# Patient Record
Sex: Male | Born: 1950 | Race: White | Hispanic: Yes | Marital: Married | Smoking: Never smoker
Health system: Southern US, Community
[De-identification: ages and names within clinical notes are randomized; demographics above are authoritative.]

## PROBLEM LIST (undated history)

## (undated) DIAGNOSIS — E119 Type 2 diabetes mellitus without complications: Secondary | ICD-10-CM

## (undated) DIAGNOSIS — I509 Heart failure, unspecified: Secondary | ICD-10-CM

## (undated) DIAGNOSIS — N189 Chronic kidney disease, unspecified: Secondary | ICD-10-CM

## (undated) DIAGNOSIS — R0600 Dyspnea, unspecified: Secondary | ICD-10-CM

## (undated) HISTORY — PX: NO PAST SURGERIES: SHX2092

---

## 2006-12-01 ENCOUNTER — Ambulatory Visit: Payer: Self-pay | Admitting: Internal Medicine

## 2010-09-25 NOTE — Assessment & Plan Note (Signed)
[facility]                             PULMONARY OFFICE NOTE   AWAIS, COBARRUBIAS                         MRN:          [id number]  DATE:12/01/2006                            DOB:          September 09, 1950    REFERRING PHYSICIAN:  Arlan Organ   REASON FOR CONSULTATION:  Abnormal spirometry.   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  He flunked his  most recent spirometry and therefore is see at Dr. Samuel Germany request.  Interestingly, he tells me he does not have any trouble at all working.  He denies any exertional chest pain, orthopnea, PND, or leg swelling,  obvious environmental or weather triggers.  Typically when he has  trouble sleeping with breathing at night it is immediately on lying  down.   PAST MEDICAL HISTORY:  1. Hypertension.  2. Diabetes.  3. Hyperlipidemia.   ALLERGIES:  None known.   MEDICATIONS:  Vasotec, hydrochlorothiazide, Lovastatin, Glucovance.  See  medication face sheet, column dated December 01, 2006, for details.   SOCIAL HISTORY:  He has never smoked.  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   FAMILY HISTORY:  Negative for respiratory diseases or atypia.   FAMILY HISTORY:  Recorded in detail on the worksheet and negative for  respiratory disease or atypia.   REVIEW OF SYSTEMS:  Also taken in detail, negative except as outlined  above.   PHYSICAL EXAMINATION:  GENERAL:  Somewhat depressed appearing Latin  American male in no acute distress.  VITAL SIGNS:  He is afebrile with stable vital signs with a blood  pressure of 140/86.  HEENT:  Remarkably unremarkable.  Oropharynx is clear.  Nasal turbinates  normal.  Ears canals clear bilaterally.  NECK:  Supple without  cervical adenopathy, tenderness.  Trachea is  midline.  No lymphadenopathy.  LUNGS:  Fields perfectly clear bilaterally on auscultation percussion  except for minimal pseudo-wheeze.  HEART:  There is a regular rhythm without murmur, gallop, or rub.  ABDOMEN:  Soft, benign.  EXTREMITIES:  Warm without calf tenderness, cyanosis, clubbing, edema.   Chest x-ray was ordered.  Lab studies included spirometry as recently as  October 28, 2006, which show classic expiratory truncation.   IMPRESSION:  Pseudo-wheeze on exam with expiratory truncation typical of  vocal cord dysfunction.  This is either due to reflux or ACE inhibitors  or both.  I did give the patient a reflux diet and recommended he stay  off the Vasotec and switch over to Micardis 80 at one half tablet daily  until see by Dr. Martha Clan.  If not improving on this regimen, the next step  would be to consider an allergy/asthma workup but I prefer that be done  by Dr. Beaulah Dinning given the extreme difficulty I had obtaining an accurate  history today directly from the patient (The daughter does the best  she  can to interpret but I think a lot was missed in translation that  could be overcome by him working directly with a Spanish-speaking asthma  specialist.  I have advised the patient that it will take up to 6 weeks  for the full washout of the ACE inhibitor to see to what extent this  is contributing to this symptoms.  I also note the patient has multiple nonspecific and quite vague  symptoms such as fatigue that may have nothing to do with his  respiratory complaints but I did not specifically address today and  again would have difficulty with this given the language barrier.  I  will therefore see the patient back on a p.r.n. basis at Dr. Samuel Germany  request.     Charlaine Dalton. Sherene Sires, MD, [facility]  Electronically Signed    MBW/MedQ  DD: 12/01/2006  DT: 12/01/2006  Job #: [id number]   cc:   Arlan Organ

## 2017-05-07 DIAGNOSIS — I509 Heart failure, unspecified: Secondary | ICD-10-CM

## 2017-05-07 DIAGNOSIS — E119 Type 2 diabetes mellitus without complications: Secondary | ICD-10-CM

## 2017-05-07 DIAGNOSIS — N179 Acute kidney failure, unspecified: Secondary | ICD-10-CM

## 2017-05-07 DIAGNOSIS — I161 Hypertensive emergency: Secondary | ICD-10-CM

## 2017-05-08 DIAGNOSIS — I509 Heart failure, unspecified: Secondary | ICD-10-CM | POA: Diagnosis not present

## 2017-05-08 DIAGNOSIS — I10 Essential (primary) hypertension: Secondary | ICD-10-CM

## 2017-05-08 DIAGNOSIS — I161 Hypertensive emergency: Secondary | ICD-10-CM | POA: Diagnosis not present

## 2017-05-08 DIAGNOSIS — E119 Type 2 diabetes mellitus without complications: Secondary | ICD-10-CM | POA: Diagnosis not present

## 2017-05-08 DIAGNOSIS — N179 Acute kidney failure, unspecified: Secondary | ICD-10-CM | POA: Diagnosis not present

## 2017-05-09 DIAGNOSIS — I509 Heart failure, unspecified: Secondary | ICD-10-CM | POA: Diagnosis not present

## 2017-05-09 DIAGNOSIS — E119 Type 2 diabetes mellitus without complications: Secondary | ICD-10-CM | POA: Diagnosis not present

## 2017-05-09 DIAGNOSIS — N179 Acute kidney failure, unspecified: Secondary | ICD-10-CM | POA: Diagnosis not present

## 2017-05-09 DIAGNOSIS — I161 Hypertensive emergency: Secondary | ICD-10-CM | POA: Diagnosis not present

## 2017-05-10 DIAGNOSIS — I509 Heart failure, unspecified: Secondary | ICD-10-CM | POA: Diagnosis not present

## 2017-05-10 DIAGNOSIS — N179 Acute kidney failure, unspecified: Secondary | ICD-10-CM | POA: Diagnosis not present

## 2017-05-10 DIAGNOSIS — I161 Hypertensive emergency: Secondary | ICD-10-CM | POA: Diagnosis not present

## 2017-05-10 DIAGNOSIS — E119 Type 2 diabetes mellitus without complications: Secondary | ICD-10-CM | POA: Diagnosis not present

## 2017-05-11 ENCOUNTER — Other Ambulatory Visit: Payer: Self-pay

## 2017-05-11 ENCOUNTER — Inpatient Hospital Stay (HOSPITAL_COMMUNITY)
Admission: AD | Admit: 2017-05-11 | Discharge: 2017-05-17 | DRG: 698 | Disposition: A | Discharge status: Home / Self Care | Payer: Commercial Managed Care - PPO | Source: Other Acute Inpatient Hospital | Attending: Internal Medicine | Admitting: Internal Medicine

## 2017-05-11 ENCOUNTER — Encounter (HOSPITAL_COMMUNITY): Payer: Self-pay | Admitting: Internal Medicine

## 2017-05-11 DIAGNOSIS — Z841 Family history of disorders of kidney and ureter: Secondary | ICD-10-CM

## 2017-05-11 DIAGNOSIS — E119 Type 2 diabetes mellitus without complications: Secondary | ICD-10-CM | POA: Diagnosis not present

## 2017-05-11 DIAGNOSIS — E1121 Type 2 diabetes mellitus with diabetic nephropathy: Secondary | ICD-10-CM | POA: Diagnosis present

## 2017-05-11 DIAGNOSIS — E8809 Other disorders of plasma-protein metabolism, not elsewhere classified: Secondary | ICD-10-CM | POA: Diagnosis present

## 2017-05-11 DIAGNOSIS — I10 Essential (primary) hypertension: Secondary | ICD-10-CM | POA: Diagnosis not present

## 2017-05-11 DIAGNOSIS — N049 Nephrotic syndrome with unspecified morphologic changes: Secondary | ICD-10-CM | POA: Diagnosis not present

## 2017-05-11 DIAGNOSIS — R7989 Other specified abnormal findings of blood chemistry: Secondary | ICD-10-CM | POA: Diagnosis not present

## 2017-05-11 DIAGNOSIS — E1122 Type 2 diabetes mellitus with diabetic chronic kidney disease: Secondary | ICD-10-CM | POA: Diagnosis present

## 2017-05-11 DIAGNOSIS — N19 Unspecified kidney failure: Secondary | ICD-10-CM | POA: Diagnosis present

## 2017-05-11 DIAGNOSIS — N009 Acute nephritic syndrome with unspecified morphologic changes: Secondary | ICD-10-CM | POA: Diagnosis present

## 2017-05-11 DIAGNOSIS — Z01810 Encounter for preprocedural cardiovascular examination: Secondary | ICD-10-CM | POA: Diagnosis not present

## 2017-05-11 DIAGNOSIS — E559 Vitamin D deficiency, unspecified: Secondary | ICD-10-CM | POA: Diagnosis not present

## 2017-05-11 DIAGNOSIS — N179 Acute kidney failure, unspecified: Secondary | ICD-10-CM | POA: Diagnosis present

## 2017-05-11 DIAGNOSIS — E279 Disorder of adrenal gland, unspecified: Secondary | ICD-10-CM | POA: Diagnosis not present

## 2017-05-11 DIAGNOSIS — Z9114 Patient's other noncompliance with medication regimen: Secondary | ICD-10-CM

## 2017-05-11 DIAGNOSIS — I132 Hypertensive heart and chronic kidney disease with heart failure and with stage 5 chronic kidney disease, or end stage renal disease: Secondary | ICD-10-CM | POA: Diagnosis not present

## 2017-05-11 DIAGNOSIS — I5031 Acute diastolic (congestive) heart failure: Secondary | ICD-10-CM | POA: Diagnosis present

## 2017-05-11 DIAGNOSIS — Z6841 Body Mass Index (BMI) 40.0 and over, adult: Secondary | ICD-10-CM | POA: Diagnosis not present

## 2017-05-11 DIAGNOSIS — N280 Ischemia and infarction of kidney: Secondary | ICD-10-CM | POA: Diagnosis present

## 2017-05-11 DIAGNOSIS — I161 Hypertensive emergency: Secondary | ICD-10-CM | POA: Diagnosis present

## 2017-05-11 DIAGNOSIS — N185 Chronic kidney disease, stage 5: Secondary | ICD-10-CM | POA: Diagnosis present

## 2017-05-11 DIAGNOSIS — N401 Enlarged prostate with lower urinary tract symptoms: Secondary | ICD-10-CM

## 2017-05-11 DIAGNOSIS — Z79899 Other long term (current) drug therapy: Secondary | ICD-10-CM

## 2017-05-11 DIAGNOSIS — N138 Other obstructive and reflux uropathy: Secondary | ICD-10-CM | POA: Diagnosis not present

## 2017-05-11 DIAGNOSIS — E669 Obesity, unspecified: Secondary | ICD-10-CM | POA: Diagnosis present

## 2017-05-11 DIAGNOSIS — E118 Type 2 diabetes mellitus with unspecified complications: Secondary | ICD-10-CM | POA: Diagnosis not present

## 2017-05-11 DIAGNOSIS — N189 Chronic kidney disease, unspecified: Secondary | ICD-10-CM

## 2017-05-11 DIAGNOSIS — I509 Heart failure, unspecified: Secondary | ICD-10-CM | POA: Diagnosis not present

## 2017-05-11 DIAGNOSIS — I160 Hypertensive urgency: Secondary | ICD-10-CM | POA: Diagnosis not present

## 2017-05-11 DIAGNOSIS — N184 Chronic kidney disease, stage 4 (severe): Secondary | ICD-10-CM | POA: Diagnosis not present

## 2017-05-11 DIAGNOSIS — E278 Other specified disorders of adrenal gland: Secondary | ICD-10-CM | POA: Diagnosis present

## 2017-05-11 HISTORY — DX: Type 2 diabetes mellitus without complications: E11.9

## 2017-05-11 LAB — CBC
HCT: 37.4 % — ABNORMAL LOW (ref 39.0–52.0)
HEMOGLOBIN: 12.8 g/dL — AB (ref 13.0–17.0)
MCH: 31.8 pg (ref 26.0–34.0)
MCHC: 34.2 g/dL (ref 30.0–36.0)
MCV: 93 fL (ref 78.0–100.0)
PLATELETS: 198 10*3/uL (ref 150–400)
RBC: 4.02 MIL/uL — AB (ref 4.22–5.81)
RDW: 13.5 % (ref 11.5–15.5)
WBC: 5.5 10*3/uL (ref 4.0–10.5)

## 2017-05-11 LAB — COMPREHENSIVE METABOLIC PANEL
ALBUMIN: 1.9 g/dL — AB (ref 3.5–5.0)
ALT: 17 U/L (ref 17–63)
AST: 26 U/L (ref 15–41)
Alkaline Phosphatase: 100 U/L (ref 38–126)
Anion gap: 7 (ref 5–15)
BILIRUBIN TOTAL: 0.6 mg/dL (ref 0.3–1.2)
BUN: 46 mg/dL — AB (ref 6–20)
CHLORIDE: 110 mmol/L (ref 101–111)
CO2: 20 mmol/L — ABNORMAL LOW (ref 22–32)
CREATININE: 4.68 mg/dL — AB (ref 0.61–1.24)
Calcium: 7.9 mg/dL — ABNORMAL LOW (ref 8.9–10.3)
GFR calc Af Amer: 14 mL/min — ABNORMAL LOW (ref >60)
GFR calc non Af Amer: 12 mL/min — ABNORMAL LOW (ref >60)
GLUCOSE: 138 mg/dL — AB (ref 65–99)
POTASSIUM: 4.3 mmol/L (ref 3.5–5.1)
Sodium: 137 mmol/L (ref 135–145)
Total Protein: 5.7 g/dL — ABNORMAL LOW (ref 6.5–8.1)

## 2017-05-11 LAB — PHOSPHORUS: Phosphorus: 5.8 mg/dL — ABNORMAL HIGH (ref 2.5–4.6)

## 2017-05-11 LAB — GLUCOSE, CAPILLARY
GLUCOSE-CAPILLARY: 128 mg/dL — AB (ref 65–99)
Glucose-Capillary: 127 mg/dL — ABNORMAL HIGH (ref 65–99)

## 2017-05-11 LAB — MAGNESIUM: Magnesium: 2.4 mg/dL (ref 1.7–2.4)

## 2017-05-11 MED ORDER — INSULIN ASPART PROT & ASPART (70-30 MIX) 100 UNIT/ML ~~LOC~~ SUSP
12.0000 [IU] | Freq: Two times a day (BID) | SUBCUTANEOUS | Status: DC
  Administered 2017-05-11 – 2017-05-17 (×11): 12 [IU] via SUBCUTANEOUS
  Filled 2017-05-11: qty 10

## 2017-05-11 MED ORDER — SODIUM CHLORIDE 0.9% FLUSH: 3.0000 mL | INTRAVENOUS | Status: DC | PRN

## 2017-05-11 MED ORDER — HEPARIN SODIUM (PORCINE) 5000 UNIT/ML IJ SOLN
5000.0000 [IU] | Freq: Three times a day (TID) | INTRAMUSCULAR | Status: AC
  Administered 2017-05-11 – 2017-05-13 (×7): 5000 [IU] via SUBCUTANEOUS
  Filled 2017-05-11 (×7): qty 1

## 2017-05-11 MED ORDER — ONDANSETRON HCL 4 MG/2ML IJ SOLN: 4.0000 mg | Freq: Four times a day (QID) | INTRAMUSCULAR | Status: DC | PRN

## 2017-05-11 MED ORDER — SODIUM CHLORIDE 0.9 % IV SOLN: 250.0000 mL | INTRAVENOUS | Status: DC | PRN

## 2017-05-11 MED ORDER — ONDANSETRON HCL 4 MG PO TABS: 4.0000 mg | ORAL_TABLET | Freq: Four times a day (QID) | ORAL | Status: DC | PRN

## 2017-05-11 MED ORDER — FUROSEMIDE 40 MG PO TABS
40.0000 mg | ORAL_TABLET | Freq: Every day | ORAL | Status: DC
  Administered 2017-05-12 – 2017-05-17 (×6): 40 mg via ORAL
  Filled 2017-05-11 (×6): qty 1

## 2017-05-11 MED ORDER — IPRATROPIUM-ALBUTEROL 0.5-2.5 (3) MG/3ML IN SOLN: 3.0000 mL | Freq: Four times a day (QID) | RESPIRATORY_TRACT | Status: DC | PRN

## 2017-05-11 MED ORDER — SODIUM CHLORIDE 0.9% FLUSH
3.0000 mL | Freq: Two times a day (BID) | INTRAVENOUS | Status: DC
  Administered 2017-05-15: 3 mL via INTRAVENOUS

## 2017-05-11 MED ORDER — CARVEDILOL 12.5 MG PO TABS
12.5000 mg | ORAL_TABLET | Freq: Two times a day (BID) | ORAL | Status: DC
  Administered 2017-05-11 – 2017-05-17 (×12): 12.5 mg via ORAL
  Filled 2017-05-11 (×12): qty 1

## 2017-05-11 MED ORDER — ACETAMINOPHEN 325 MG PO TABS: 650.0000 mg | ORAL_TABLET | Freq: Four times a day (QID) | ORAL | Status: DC | PRN

## 2017-05-11 MED ORDER — ISOSORBIDE MONONITRATE ER 60 MG PO TB24
90.0000 mg | ORAL_TABLET | Freq: Every day | ORAL | Status: DC
  Administered 2017-05-12 – 2017-05-17 (×6): 90 mg via ORAL
  Filled 2017-05-11 (×6): qty 1

## 2017-05-11 MED ORDER — INSULIN ASPART 100 UNIT/ML ~~LOC~~ SOLN
0.0000 [IU] | Freq: Three times a day (TID) | SUBCUTANEOUS | Status: DC
  Administered 2017-05-11: 1 [IU] via SUBCUTANEOUS

## 2017-05-11 MED ORDER — HYDRALAZINE HCL 50 MG PO TABS
50.0000 mg | ORAL_TABLET | Freq: Three times a day (TID) | ORAL | Status: DC
  Administered 2017-05-11 – 2017-05-12 (×3): 50 mg via ORAL
  Filled 2017-05-11 (×3): qty 1

## 2017-05-11 MED ORDER — ACETAMINOPHEN 650 MG RE SUPP: 650.0000 mg | Freq: Four times a day (QID) | RECTAL | Status: DC | PRN

## 2017-05-11 MED ORDER — SODIUM CHLORIDE 0.9% FLUSH
3.0000 mL | Freq: Two times a day (BID) | INTRAVENOUS | Status: DC
  Administered 2017-05-11 – 2017-05-16 (×10): 3 mL via INTRAVENOUS

## 2017-05-11 NOTE — H&P (Signed)
History and Physical    Nicholas Gutierrez ZOX:[id number] DOB: 1950-11-01 DOA: 05/11/2017  PCP: No primary care provider on file.   I have briefly reviewed patients [facility].  Patient coming from: [facility]  Chief Complaint: Shortness of breath, swelling, headache; transfer with worsening renal failure.  HPI: Nicholas Gutierrez is a 66 year old Spanish-speaking male with a history of diabetes, hypertension, medication noncompliance and obesity; who presented to the hospital on December 26 with a progressive shortness of breath and lower extremity swelling, these symptoms has been going on for over 3 weeks and worsening.  Patient has no follow with any doctor for over 7 years now and was essentially watching the amount of carbohydrates in his diet only as part of treatment.  He reported increase shortness of breath, swelling and headaches as part of the recent obesity hospital at this time and was found to be in hypertensive urgency.  Patient was placed on Nipride and admitted to the ICU around the hospital with a subsequent transition to oral medication regimen and off Nipride drip for over 48 hours prior to be transferred.  Patient was transfer in order to continue further evaluation and workup for renal failure, workup might include renal biopsy if needed.  Dr. Signe ColtUpton from the nephrology service is aware of admission and will follow the patient along with us.  Please contact Dr. Randol KernElgergawy with any further information/details needed about this patient care provider [facility] and pending results from autoimmune workup of his renal failure.  Review of Systems:  All other systems reviewed and apart from HPI, are negative.  Past medical history: Hypertension Type 2 diabetes Morbid obesity   History reviewed. No pertinent surgical history.  Social History  has no tobacco, alcohol, and drug history on file.  No Known Allergies  Family  history: -Positive for diabetes and hypertension.  Prior to Admission medications   Not on File    Physical Exam: Vitals:   05/11/17 1208 05/11/17 1554  BP: (!) 161/89 (!) 175/105  Pulse: 74 73  Resp: 20 20  Temp: 98.3 F (36.8 C) 97.9 F (36.6 C)  TempSrc: Oral Oral  SpO2: 98% 98%  Weight: 105 kg (231 lb 7.7 oz)   Height: 5\' 2"  (1.575 m)    Constitutional: Afebrile, denies chest pain, patient expressed improvement in his breathing status.  No nausea or vomiting.  In no acute distress. Eyes: PERTLA, lids and conjunctivae normal ENMT: Mucous membranes are moist. Posterior pharynx clear of any exudate or lesions. Normal dentition.  Neck: supple, no masses, no thyromegaly, unable to properly assess JVD due to body habitus. Respiratory: Decreased breath sounds at the bases, no wheezing, no frank crackles. Normal respiratory effort. No accessory muscle use.  Cardiovascular: S1 & S2 heard, regular rate and rhythm, no murmurs / rubs / gallops.  1+ extremity edema. 2+ pedal pulses. No carotid bruits.  Abdomen: Obese, no tenderness, no masses palpated. No hepatosplenomegaly. Bowel sounds normal.  Musculoskeletal: no clubbing / cyanosis. No joint deformity upper and lower extremities. Good ROM, no contractures. Normal muscle tone.  Skin: no rashes, lesions/petechiae, open ulcers or induration.   Neurologic: CN 2-12 grossly intact. Sensation intact, DTR normal. Strength 5/5 in all 4 limbs.  Psychiatric: Normal judgment and insight. Alert and oriented x 3. Normal mood.   Radiological Exams on Admission: No results found.  EKG:  None here   Assessment/Plan 1-acute renal failure: Unclear if this is acute on chronic  -Workup  initiated at [facility] where autoimmune component is in the process of being rule out -Dr. Randol KernElgergawy can be contacted for any questions and to further share pending results at the moment of transfer. -Nephrology service has been involved and they will see  patients in order to further assist with workup for renal failure; this might include renal biopsy. Continue to minimize the use of nephrotoxic agents -Follow creatinine trend -Most likely acute on chronic in the setting of uncontrolled hypertension, long-standing diabetes and acute on chronic heart failure with diuresis. -Will follow renal service recommendations. -Cr 4.4 currently  2-Hypertensive urgency -Required nitro drip -Control and improved now -Continue Coreg, Imdur, Lasix, hydralazine  3-Type 2 diabetes with nephropathy (HCC) -A1C 6.6 -continue 70/30 and SSI -modified carb diet ordered  4-Obesity: morbid; Class 3 -Body mass index is 42.34 kg/m. -low calorie diet and exercise encouraged   5-Acute diastolic CHF (congestive heart failure) (HCC) -most likely associated with HTN -improved with diuresis -no requiring oxygen supplementation and comfortable while laying semi-flat -still with some LE swelling and decrease BS at bases -continue oral lasix -follow daily weight, strict intake and output  -heart healthy diet ordered  6-Elevated d-dimer -neg V/Q scan -no CP -most likely associated with renal failure  7-BPH with urinary obstruction -now with foley removed and able to void spontaneously -will follow for symptoms of urinary retention   8-Adrenal mass (HCC) -Incidental finding -Will need follow-up with endocrinology as an outpatient   Time: 60 minutes   DVT prophylaxis: Heparin Code Status: Full code Family Communication: Wife at bedside Disposition Plan: Anticipate discharge back home once medically stable. Consults called: Nephrology (Dr. Signe ColtUpton) Admission status: Inpatient, length of stay more than 2 midnights, telemetry.   Vassie Lollarlos Rivaan Kendall MD [facility] Pager [phone]  If 7PM-7AM, please contact night-coverage [email or web address] Password [email or web address]  05/11/2017, 4:11 PM

## 2017-05-11 NOTE — Progress Notes (Signed)
Patient is spanish speaking only. Interpreter used at bedside for assessment and admission. Wife is at bedside.

## 2017-05-11 NOTE — Progress Notes (Signed)
Attemepted to call for report  The number provided was to the main switchboard. Writer was told the RN first name only. Will wait for someone to call back.

## 2017-05-12 ENCOUNTER — Encounter (HOSPITAL_COMMUNITY): Payer: Self-pay | Admitting: General Surgery

## 2017-05-12 LAB — URINALYSIS, COMPLETE (UACMP) WITH MICROSCOPIC
Bilirubin Urine: NEGATIVE
HGB URINE DIPSTICK: NEGATIVE
Ketones, ur: 5 mg/dL — AB
LEUKOCYTES UA: NEGATIVE
NITRITE: NEGATIVE
SPECIFIC GRAVITY, URINE: 1.013 (ref 1.005–1.030)
pH: 6 (ref 5.0–8.0)

## 2017-05-12 LAB — BASIC METABOLIC PANEL
ANION GAP: 6 (ref 5–15)
BUN: 50 mg/dL — ABNORMAL HIGH (ref 6–20)
CHLORIDE: 111 mmol/L (ref 101–111)
CO2: 20 mmol/L — AB (ref 22–32)
Calcium: 7.9 mg/dL — ABNORMAL LOW (ref 8.9–10.3)
Creatinine, Ser: 4.77 mg/dL — ABNORMAL HIGH (ref 0.61–1.24)
GFR calc non Af Amer: 12 mL/min — ABNORMAL LOW (ref >60)
GFR, EST AFRICAN AMERICAN: 14 mL/min — AB (ref >60)
Glucose, Bld: 98 mg/dL (ref 65–99)
Potassium: 3.9 mmol/L (ref 3.5–5.1)
SODIUM: 137 mmol/L (ref 135–145)

## 2017-05-12 LAB — PROTEIN / CREATININE RATIO, URINE
CREATININE, URINE: 99.24 mg/dL
Protein Creatinine Ratio: 8.88 mg/mg{Cre} — ABNORMAL HIGH (ref 0.00–0.15)
Total Protein, Urine: 881 mg/dL

## 2017-05-12 LAB — HIV ANTIBODY (ROUTINE TESTING W REFLEX): HIV Screen 4th Generation wRfx: NONREACTIVE

## 2017-05-12 LAB — CREATININE, URINE, RANDOM: CREATININE, URINE: 98.91 mg/dL

## 2017-05-12 LAB — CORTISOL: Cortisol, Plasma: 11.8 ug/dL

## 2017-05-12 LAB — TSH: TSH: 6.154 u[IU]/mL — AB (ref 0.350–4.500)

## 2017-05-12 LAB — GLUCOSE, CAPILLARY
GLUCOSE-CAPILLARY: 106 mg/dL — AB (ref 65–99)
GLUCOSE-CAPILLARY: 101 mg/dL — AB (ref 65–99)
GLUCOSE-CAPILLARY: 122 mg/dL — AB (ref 65–99)
GLUCOSE-CAPILLARY: 119 mg/dL — AB (ref 65–99)
Glucose-Capillary: 130 mg/dL — ABNORMAL HIGH (ref 65–99)

## 2017-05-12 LAB — SODIUM, URINE, RANDOM: SODIUM UR: 44 mmol/L

## 2017-05-12 MED ORDER — HYDRALAZINE HCL 50 MG PO TABS
100.0000 mg | ORAL_TABLET | Freq: Three times a day (TID) | ORAL | Status: DC
  Administered 2017-05-12 – 2017-05-17 (×16): 100 mg via ORAL
  Filled 2017-05-12 (×16): qty 2

## 2017-05-12 MED ORDER — AMLODIPINE BESYLATE 5 MG PO TABS
5.0000 mg | ORAL_TABLET | Freq: Every day | ORAL | Status: DC
Start: 2017-05-12 — End: 2017-05-17
  Administered 2017-05-12 – 2017-05-17 (×6): 5 mg via ORAL
  Filled 2017-05-12 (×6): qty 1

## 2017-05-12 MED ORDER — HEPARIN SODIUM (PORCINE) 5000 UNIT/ML IJ SOLN
5000.0000 [IU] | Freq: Three times a day (TID) | INTRAMUSCULAR | Status: DC
  Administered 2017-05-14 – 2017-05-17 (×9): 5000 [IU] via SUBCUTANEOUS
  Filled 2017-05-12 (×9): qty 1

## 2017-05-12 NOTE — Plan of Care (Signed)
  Progressing Education: Knowledge of General Education information will improve 05/12/2017 0825 - Progressing by Epimenio FootGurley, Tayja Manzer D, RN Health Behavior/Discharge Planning: Ability to manage health-related needs will improve 05/12/2017 0825 - Progressing by Epimenio FootGurley, Donice Alperin D, RN Clinical Measurements: Ability to maintain clinical measurements within normal limits will improve 05/12/2017 0825 - Progressing by Epimenio FootGurley, Arynn Armand D, RN

## 2017-05-12 NOTE — Progress Notes (Signed)
[facility] PROGRESS NOTE    Progress Note  Nicholas Gutierrez  ION:[id number] DOB: July 20, 1950 DOA: 05/11/2017 PCP: Patient, No Pcp Per     Brief Narrative:   Nicholas Gutierrez is an 66 y.o. male past medical history of diabetes, hypertension medication noncompliance and obesity who presented on December 26 to [facility] with progressive shortness of breath and lower extremity swelling which began 3 weeks prior to that admission, placed on night pride and [facility] for workup of her acute renal failure.  Assessment/Plan:   Acute kidney injury: Unclear of etiology, nephrology services have been consulted, and awaiting recommendations. Most likely cause of her acute renal failure is likely due to uncontrolled hypertension in the setting of long-standing diabetes mellitus.  Hypertensive urgency: Requiring nitro drip initially now improved, continue Coreg, Imdur, Lasix and hydralazine.  Diabetes mellitus type 2 with nephropathy: With an A1c of 6.6, Continue 20/30 stop sliding scale insulin.  Morbid obesity:  Acute diastolic heart failure: Likely due to essential hypertension: We will continue IV Lasix, strict I's and O's Daily weight. Replete potassium orally.  Elevated d-dimer: Likely due to renal failure VQ scan negative she denies any chest pain.  BPH: With urinary obstruction Foley now removed able to void spontaneously.  Adrenal mass incidentaloma: Will need follow-up with endocrine as an outpatient.   DVT prophylaxis: lovenox Family Communication:son Disposition Plan/Barrier to D/C: unable to determine Code Status:     Code Status Orders  (From admission, onward)        Start     Ordered   05/11/17 1615  Full code  Continuous     05/11/17 1614    Code Status History    Date Active Date Inactive Code Status Order ID Comments User Context   This patient has a current code status but no historical code status.        IV  Access:    Peripheral IV   Procedures and diagnostic studies:   No results found.   Medical Consultants:    None.  Anti-Infectives:   None  Subjective:    Nicholas Gutierrez no complains  Objective:    Vitals:   05/11/17 1554 05/11/17 2119 05/11/17 2148 05/12/17 0659  BP: (!) 175/105 (!) 166/91 (!) 168/96 (!) 175/88  Pulse: 73  73 74  Resp: 20  18 18   Temp: 97.9 F (36.6 C)  98.2 F (36.8 C) 98.2 F (36.8 C)  TempSrc: Oral  Oral Oral  SpO2: 98%  99% 96%  Weight: 105 kg (231 lb 7.7 oz)     Height:        Intake/Output Summary (Last 24 hours) at 05/12/2017 0758 Last data filed at 05/12/2017 0600 Gross per 24 hour  Intake 3 ml  Output -  Net 3 ml   Filed Weights   05/11/17 1208 05/11/17 1554  Weight: 105 kg (231 lb 7.7 oz) 105 kg (231 lb 7.7 oz)    Exam: General exam: In no acute distress. Respiratory system: Good air movement and clear to auscultation. Cardiovascular system: S1 & S2 heard, RRR. Gastrointestinal system: Abdomen is nondistended, soft and nontender.  Central nervous system: Alert and oriented. No focal neurological deficits. Extremities: No pedal edema. Skin: No rashes, lesions or ulcers Psychiatry: Judgement and insight appear normal. Mood & affect appropriate.    Data Reviewed:    Labs: Basic Metabolic Panel: Recent Labs  Lab 05/11/17 1618 05/12/17 0431  NA 137 137  K 4.3 3.9  CL 110  111  CO2 20* 20*  GLUCOSE 138* 98  BUN 46* 50*  CREATININE 4.68* 4.77*  CALCIUM 7.9* 7.9*  MG 2.4  --   PHOS 5.8*  --    GFR Estimated Creatinine Clearance: 16.6 mL/min (A) (by C-G formula based on SCr of 4.77 mg/dL (H)). Liver Function Tests: Recent Labs  Lab 05/11/17 1618  AST 26  ALT 17  ALKPHOS 100  BILITOT 0.6  PROT 5.7*  ALBUMIN 1.9*   No results for input(s): LIPASE, AMYLASE in the last 168 hours. No results for input(s): AMMONIA in the last 168 hours. Coagulation profile No results for input(s): INR, PROTIME in the last  168 hours.  CBC: Recent Labs  Lab 05/11/17 1618  WBC 5.5  HGB 12.8*  HCT 37.4*  MCV 93.0  PLT 198   Cardiac Enzymes: No results for input(s): CKTOTAL, CKMB, CKMBINDEX, TROPONINI in the last 168 hours. BNP (last 3 results) No results for input(s): PROBNP in the last 8760 hours. CBG: Recent Labs  Lab 05/11/17 1719 05/11/17 2217 05/12/17 0725  GLUCAP 127* 128* 106*   D-Dimer: No results for input(s): DDIMER in the last 72 hours. Hgb A1c: No results for input(s): HGBA1C in the last 72 hours. Lipid Profile: No results for input(s): CHOL, HDL, LDLCALC, TRIG, CHOLHDL, LDLDIRECT in the last 72 hours. Thyroid function studies: No results for input(s): TSH, T4TOTAL, T3FREE, THYROIDAB in the last 72 hours.  Invalid input(s): FREET3 Anemia work up: No results for input(s): VITAMINB12, FOLATE, FERRITIN, TIBC, IRON, RETICCTPCT in the last 72 hours. Sepsis Labs: Recent Labs  Lab 05/11/17 1618  WBC 5.5   Microbiology No results found for this or any previous visit (from the past 240 hour(s)).   Medications:   . carvedilol  12.5 mg Oral BID WC  . furosemide  40 mg Oral Daily  . heparin  5,000 Units Subcutaneous Q8H  . hydrALAZINE  50 mg Oral Q8H  . insulin aspart  0-9 Units Subcutaneous TID WC  . insulin aspart protamine- aspart  12 Units Subcutaneous BID WC  . isosorbide mononitrate  90 mg Oral Daily  . sodium chloride flush  3 mL Intravenous Q12H  . sodium chloride flush  3 mL Intravenous Q12H   Continuous Infusions: . sodium chloride       LOS: 1 day   [name]  [facility] Pager [phone]  *Please refer to [email or web address], password [email or web address] to get updated schedule on who will round on this patient, as hospitalists switch teams weekly. If 7PM-7AM, please contact night-coverage at [email or web address], password [email or web address] for any overnight needs.  05/12/2017, 7:58 AM

## 2017-05-12 NOTE — Progress Notes (Signed)
Received report from previous RN. Pts assessment remains unchanged from the AM assessment. Will continue to monitor

## 2017-05-12 NOTE — Consult Note (Signed)
Reason for Consult: ARF Referring Physician:  David StallFeliz Ortiz, MD  Nicholas Gutierrez is an 66 y.o. male.  HPI:  Nicholas Gutierrez is a 66 yo WM with PMH significant for DM, HTN, and medical noncompliance who [facility] with a 3 week history of worsening SOB, lower extremity edema, and malaise.  He was noted to be markedly hypertensive and admitted to the ICU and placed on nipride gtt.  His Scr was 4.1 on admission and continued to climb which prompted [facility].  He has noted foamy/frothy urine for sometime but could not determine when he first noted it.  He admits to sporadic NSAID use but not regular or heavy use.  He does have a family history of kidney disease (his daughter is a dialysis patient in [place] and his mother also had swelling and "kidney issues").    He denies any N/V/D, hematuria, pyuria, dysuria, urinary retention or hesitancy.  He reports that his edema has improved and he feels better following IV diuresis.  He does not see a physician regularly despite having DM and HTN.  He was never told that he had any kidney issues, however no UA or creatinine levels are available for review prior to this hospitalization.  Renal US showed normal echogenicity but did note a right adrenal mass.   Nicholas Gutierrez has limited English and so a tele-interpreter system was used to obtain this history.   Trend in Creatinine: Creatinine, Ser  Date/Time Value Ref Range Status  05/12/2017 04:31 AM 4.77 (H) 0.61 - 1.24 mg/dL Final  40/98/119112/30/2018 47:8204:18 PM 4.68 (H) 0.61 - 1.24 mg/dL Final    PMH:  History reviewed. No pertinent past medical history.  PSH:  History reviewed. No pertinent surgical history.  Allergies: No Known Allergies  Medications:   Prior to Admission medications   Not on File    Inpatient medications: . amLODipine  5 mg Oral Daily  . carvedilol  12.5 mg Oral BID WC  . furosemide  40 mg Oral Daily  . heparin  5,000 Units  Subcutaneous Q8H  . hydrALAZINE  100 mg Oral Q8H  . insulin aspart protamine- aspart  12 Units Subcutaneous BID WC  . isosorbide mononitrate  90 mg Oral Daily  . sodium chloride flush  3 mL Intravenous Q12H  . sodium chloride flush  3 mL Intravenous Q12H    Discontinued Meds:   Medications Discontinued During This Encounter  Medication Reason  . insulin aspart (novoLOG) injection 0-9 Units   . hydrALAZINE (APRESOLINE) tablet 50 mg     Social History:  has no tobacco, alcohol, and drug history on file.  Family History:  History reviewed. No pertinent family history.  Pertinent items are noted in HPI. Weight change:   Intake/Output Summary (Last 24 hours) at 05/12/2017 1439 Last data filed at 05/12/2017 0941 Gross per 24 hour  Intake 6 ml  Output -  Net 6 ml   BP (!) 154/88 (BP Location: Right Arm)   Pulse 64   Temp 97.8 F (36.6 C) (Oral)   Resp 14   Ht 5\' 2"  (1.575 m)   Wt 105 kg (231 lb 7.7 oz)   SpO2 96%   BMI 42.34 kg/m  Vitals:   05/11/17 2148 05/12/17 0659 05/12/17 0940 05/12/17 1304  BP: (!) 168/96 (!) 175/88 (!) 141/84 (!) 154/88  Pulse: 73 74  64  Resp: 18 18  14   Temp: 98.2 F (36.8 C) 98.2 F (  36.8 C)  97.8 F (36.6 C)  TempSrc: Oral Oral  Oral  SpO2: 99% 96%  96%  Weight:      Height:         General appearance: alert, cooperative and no distress Head: Normocephalic, without obvious abnormality, atraumatic Resp: rales bibasilar Cardio: no rub GI: soft, non-tender; bowel sounds normal; no masses,  no organomegaly Extremities: edema 1+ pitting  Labs: Basic Metabolic Panel: Recent Labs  Lab 05/11/17 1618 05/12/17 0431  NA 137 137  K 4.3 3.9  CL 110 111  CO2 20* 20*  GLUCOSE 138* 98  BUN 46* 50*  CREATININE 4.68* 4.77*  ALBUMIN 1.9*  --   CALCIUM 7.9* 7.9*  PHOS 5.8*  --    Liver Function Tests: Recent Labs  Lab 05/11/17 1618  AST 26  ALT 17  ALKPHOS 100  BILITOT 0.6  PROT 5.7*  ALBUMIN 1.9*   No results for input(s):  LIPASE, AMYLASE in the last 168 hours. No results for input(s): AMMONIA in the last 168 hours. CBC: Recent Labs  Lab 05/11/17 1618  WBC 5.5  HGB 12.8*  HCT 37.4*  MCV 93.0  PLT 198   PT/INR: @LABRCNTIP (inr:5) Cardiac Enzymes: )No results for input(s): CKTOTAL, CKMB, CKMBINDEX, TROPONINI in the last 168 hours. CBG: Recent Labs  Lab 05/11/17 1719 05/11/17 2217 05/12/17 0725 05/12/17 0851 05/12/17 1200  GLUCAP 127* 128* 106* 101* 122*    Iron Studies: No results for input(s): IRON, TIBC, TRANSFERRIN, FERRITIN in the last 168 hours.  Xrays/Other Studies: No results found.   Assessment/Plan: 1.  ARF vs subacute (no prior labs to review progression)-  Possibly related to nephrotic syndrome and ATN but need UA and r/o acute GN.  Discussed his labs and the need for a renal biopsy through an interpreter and he and his wife are amenable to proceed.  Have sent serologies and will consult IR for renal biopsy 2. Acute diastolic CHF- ECHO performed at OSH.  Diuresing well. 3. Hypoalbuminemia- possibly due to nephrotic syndrome.  Will need to quantify 24 hour urine for protein.  4. Hypertensive urgency- improved off of nipride.  Continue with coreg, hydralazine, and lasix 5. Adrenal mass- will check renin/aldo levels which may explain his hypertensive urgency. 6. Urinary retention- as noted by primary.  Will need to follow bladder scans and may require I&O catheter. 7. DM- per primary 8. Obesity 9. Elevated D-Dimer, negative VQ scan.   Nicholas Gutierrez 05/12/2017, 2:39 PM

## 2017-05-12 NOTE — Consult Note (Signed)
Chief Complaint: ARI  Referring Physician:Dr. Donato Heinz  Supervising Physician: Sandi Mariscal  Patient Status: [facility] - In-pt  HPI: Nicholas Gutierrez is a 66 y.o. male with a history of uncontrolled HTN and DM who presented to [facility] with a HA.  He was found to have markedly elevated BP and was admitted for a hypertensive emergency.  He was started on a drip and his BP has improved.  He was also noted to have peripheral edema and his Cr was noted to be 4.1.  This has continued to trend up to 4.6.  Nephrology evaluated him today and requested a random renal biopsy to help determine an etiology of his renal insufficiency.    Video Interpretor was used to obtain his history and consent.  Past Medical History:  HEN DM  Past Surgical History: History reviewed. No pertinent surgical history.  Family History: History reviewed. No pertinent family history.  Social History:  has no tobacco, alcohol, and drug history on file.  Allergies: No Known Allergies  Medications: Medications reviewed in epic  Please HPI for pertinent positives, otherwise complete 10 system ROS negative.  Mallampati Score: MD Evaluation Airway: WNL Heart: WNL Abdomen: WNL Chest/ Lungs: WNL ASA  Classification: 3 Mallampati/Airway Score: One  Physical Exam: BP (!) 154/88 (BP Location: Right Arm)   Pulse 64   Temp 97.8 F (36.6 C) (Oral)   Resp 14   Ht 5' 2"  (1.575 m)   Wt 231 lb 7.7 oz (105 kg)   SpO2 96%   BMI 42.34 kg/m  Body mass index is 42.34 kg/m. General: pleasant, obese Hispanic male who is laying in bed in NAD HEENT: head is normocephalic, atraumatic.  Sclera are noninjected.  PERRL.  Ears and nose without any masses or lesions.  Mouth is pink and moist Heart: regular, rate, and rhythm.  Normal s1,s2. No obvious murmurs, gallops, or rubs noted.  Palpable radial pulses bilaterally Lungs: CTAB, no wheezes, rhonchi, or rales noted.  Respiratory effort nonlabored Abd: soft, NT, ND,  +BS, no masses, hernias, or organomegaly Psych: A&Ox3 with an appropriate affect.   Labs: Results for orders placed or performed during the hospital encounter of 05/11/17 (from the past 48 hour(s))  HIV antibody (Routine Testing)     Status: None   Collection Time: 05/11/17  4:18 PM  Result Value Ref Range   HIV Screen 4th Generation wRfx Non Reactive Non Reactive    Comment: (NOTE) Performed At: [facility] [place], [place] [id number] Rush Farmer MD [id number]   CBC     Status: Abnormal   Collection Time: 05/11/17  4:18 PM  Result Value Ref Range   WBC 5.5 4.0 - 10.5 K/uL   RBC 4.02 (L) 4.22 - 5.81 MIL/uL   Hemoglobin 12.8 (L) 13.0 - 17.0 g/dL   HCT 37.4 (L) 39.0 - 52.0 %   MCV 93.0 78.0 - 100.0 fL   MCH 31.8 26.0 - 34.0 pg   MCHC 34.2 30.0 - 36.0 g/dL   RDW 13.5 11.5 - 15.5 %   Platelets 198 150 - 400 K/uL  Comprehensive metabolic panel     Status: Abnormal   Collection Time: 05/11/17  4:18 PM  Result Value Ref Range   Sodium 137 135 - 145 mmol/L   Potassium 4.3 3.5 - 5.1 mmol/L   Chloride 110 101 - 111 mmol/L   CO2 20 (L) 22 - 32 mmol/L   Glucose, Bld 138 (H) 65 - 99 mg/dL  BUN 46 (H) 6 - 20 mg/dL   Creatinine, Ser 4.68 (H) 0.61 - 1.24 mg/dL   Calcium 7.9 (L) 8.9 - 10.3 mg/dL   Total Protein 5.7 (L) 6.5 - 8.1 g/dL   Albumin 1.9 (L) 3.5 - 5.0 g/dL   AST 26 15 - 41 U/L   ALT 17 17 - 63 U/L   Alkaline Phosphatase 100 38 - 126 U/L   Total Bilirubin 0.6 0.3 - 1.2 mg/dL   GFR calc non Af Amer 12 (L) >60 mL/min   GFR calc Af Amer 14 (L) >60 mL/min    Comment: (NOTE) The eGFR has been calculated using the CKD EPI equation. This calculation has not been validated in all clinical situations. eGFR's persistently <60 mL/min signify possible Chronic Kidney Disease.    Anion gap 7 5 - 15  Magnesium     Status: None   Collection Time: 05/11/17  4:18 PM  Result Value Ref Range   Magnesium 2.4 1.7 - 2.4 mg/dL  Phosphorus     Status: Abnormal     Collection Time: 05/11/17  4:18 PM  Result Value Ref Range   Phosphorus 5.8 (H) 2.5 - 4.6 mg/dL  Glucose, capillary     Status: Abnormal   Collection Time: 05/11/17  5:19 PM  Result Value Ref Range   Glucose-Capillary 127 (H) 65 - 99 mg/dL  Glucose, capillary     Status: Abnormal   Collection Time: 05/11/17 10:17 PM  Result Value Ref Range   Glucose-Capillary 128 (H) 65 - 99 mg/dL  Basic metabolic panel     Status: Abnormal   Collection Time: 05/12/17  4:31 AM  Result Value Ref Range   Sodium 137 135 - 145 mmol/L   Potassium 3.9 3.5 - 5.1 mmol/L   Chloride 111 101 - 111 mmol/L   CO2 20 (L) 22 - 32 mmol/L   Glucose, Bld 98 65 - 99 mg/dL   BUN 50 (H) 6 - 20 mg/dL   Creatinine, Ser 4.77 (H) 0.61 - 1.24 mg/dL   Calcium 7.9 (L) 8.9 - 10.3 mg/dL   GFR calc non Af Amer 12 (L) >60 mL/min   GFR calc Af Amer 14 (L) >60 mL/min    Comment: (NOTE) The eGFR has been calculated using the CKD EPI equation. This calculation has not been validated in all clinical situations. eGFR's persistently <60 mL/min signify possible Chronic Kidney Disease.    Anion gap 6 5 - 15  Glucose, capillary     Status: Abnormal   Collection Time: 05/12/17  7:25 AM  Result Value Ref Range   Glucose-Capillary 106 (H) 65 - 99 mg/dL  Glucose, capillary     Status: Abnormal   Collection Time: 05/12/17  8:51 AM  Result Value Ref Range   Glucose-Capillary 101 (H) 65 - 99 mg/dL  Glucose, capillary     Status: Abnormal   Collection Time: 05/12/17 12:00 PM  Result Value Ref Range   Glucose-Capillary 122 (H) 65 - 99 mg/dL  TSH     Status: Abnormal   Collection Time: 05/12/17  1:56 PM  Result Value Ref Range   TSH 6.154 (H) 0.350 - 4.500 uIU/mL    Comment: Performed by a 3rd Generation assay with a functional sensitivity of <=0.01 uIU/mL.  Glucose, capillary     Status: Abnormal   Collection Time: 05/12/17  4:19 PM  Result Value Ref Range   Glucose-Capillary 119 (H) 65 - 99 mg/dL    Imaging: No results  found.  Assessment/Plan 1. ARI  IR will plan for a random renal biopsy on Wednesday 1/2 as able.  Nephrology will need to obtain the [facility] sheet and place in the chart for his biopsy.  He will be NPO p MN on 1/2.  His history and risks and complications were discussed with the patient via interpretor.   Risks and benefits discussed with the patient including, but not limited to bleeding, infection, damage to adjacent structures or low yield requiring additional tests. All of the patient's questions were answered, patient is agreeable to proceed. Consent signed and in chart.   Thank you for this interesting consult.  I greatly enjoyed meeting Nicholas Gutierrez and look forward to participating in their care.  A copy of this report was sent to the requesting provider on this date.  Electronically Signed: Henreitta Cea 05/12/2017, 4:33 PM   I spent a total of 40 Minutes    in face to face in clinical consultation, greater than 50% of which was counseling/coordinating care for acute renal insufficiency

## 2017-05-13 LAB — VITAMIN D 25 HYDROXY (VIT D DEFICIENCY, FRACTURES): VIT D 25 HYDROXY: 4.4 ng/mL — AB (ref 30.0–100.0)

## 2017-05-13 LAB — RENAL FUNCTION PANEL
Albumin: 1.9 g/dL — ABNORMAL LOW (ref 3.5–5.0)
Anion gap: 7 (ref 5–15)
BUN: 52 mg/dL — AB (ref 6–20)
CALCIUM: 8 mg/dL — AB (ref 8.9–10.3)
CO2: 21 mmol/L — ABNORMAL LOW (ref 22–32)
CREATININE: 4.62 mg/dL — AB (ref 0.61–1.24)
Chloride: 109 mmol/L (ref 101–111)
GFR calc Af Amer: 14 mL/min — ABNORMAL LOW (ref >60)
GFR, EST NON AFRICAN AMERICAN: 12 mL/min — AB (ref >60)
Glucose, Bld: 87 mg/dL (ref 65–99)
PHOSPHORUS: 5.5 mg/dL — AB (ref 2.5–4.6)
POTASSIUM: 4.1 mmol/L (ref 3.5–5.1)
SODIUM: 137 mmol/L (ref 135–145)

## 2017-05-13 LAB — PROTIME-INR
INR: 0.97
PROTHROMBIN TIME: 12.8 s (ref 11.4–15.2)

## 2017-05-13 LAB — GLUCOSE, CAPILLARY
GLUCOSE-CAPILLARY: 122 mg/dL — AB (ref 65–99)
GLUCOSE-CAPILLARY: 99 mg/dL (ref 65–99)
Glucose-Capillary: 92 mg/dL (ref 65–99)
Glucose-Capillary: 86 mg/dL (ref 65–99)

## 2017-05-13 LAB — COMPLEMENT, TOTAL: Compl, Total (CH50): >60 U/mL (ref >41)

## 2017-05-13 LAB — ANTISTREPTOLYSIN O TITER: ASO: 170 IU/mL (ref 0.0–200.0)

## 2017-05-13 LAB — C4 COMPLEMENT: Complement C4, Body Fluid: 24 mg/dL (ref 14–44)

## 2017-05-13 LAB — PARATHYROID HORMONE, INTACT (NO CA): PTH: 118 pg/mL — ABNORMAL HIGH (ref 15–65)

## 2017-05-13 LAB — C3 COMPLEMENT: C3 COMPLEMENT: 132 mg/dL (ref 82–167)

## 2017-05-13 MED ORDER — HYDROCODONE-ACETAMINOPHEN 5-325 MG PO TABS: 1.0000 | ORAL_TABLET | ORAL | Status: DC | PRN

## 2017-05-13 MED ORDER — POLYETHYLENE GLYCOL 3350 17 G PO PACK
17.0000 g | PACK | Freq: Every day | ORAL | Status: DC
  Administered 2017-05-13 – 2017-05-17 (×4): 17 g via ORAL
  Filled 2017-05-13 (×4): qty 1

## 2017-05-13 NOTE — Plan of Care (Signed)
  Completed/Met Nutrition: Adequate nutrition will be maintained 05/13/2017 0926 - Completed/Met by Saunders Glance, RN

## 2017-05-13 NOTE — Progress Notes (Signed)
[facility] PROGRESS NOTE    Progress Note  Nicholas Gutierrez  [id number] DOB: 04-Mar-1951 DOA: 05/11/2017 PCP: Patient, No Pcp Per     Brief Narrative:   Nicholas Gutierrez is an 67 y.o. male past medical history of diabetes, hypertension medication noncompliance and obesity who presented on December 26 to [facility] with progressive shortness of breath and lower extremity swelling which began 3 weeks prior to that admission, placed on night pride and transferred to [facility] hospital for workup of her acute renal failure.  Assessment/Plan:   Acute kidney injury: Unclear of etiology, nephrology services have been consulted. Recommended renal biopsy, they are concerned about nephrotic syndrome and ATN. Schedule for 05-2017. 24-hour urine protein pending.  Hypertensive urgency: Initially requiring IV drip, now off. continue Coreg, Imdur, Lasix and hydralazine.  Diabetes mellitus type 2 with nephropathy: With an A1c of 6.6, Continue 70/30 stop sliding scale insulin.  Morbid obesity:  Acute diastolic heart failure: Likely due to essential hypertension: We will continue IV Lasix, strict I's and O's Daily weight. Replete potassium orally.  Elevated d-dimer: Likely due to renal failure VQ scan negative she denies any chest pain.  BPH: With urinary obstruction Foley now removed able to void spontaneously.  Adrenal mass incidentaloma: Checking renon/aldo level.   DVT prophylaxis: lovenox Family Communication:son Disposition Plan/Barrier to D/C: unable to determine Code Status:     Code Status Orders  (From admission, onward)        Start     Ordered   05/11/17 1615  Full code  Continuous     05/11/17 1614    Code Status History    Date Active Date Inactive Code Status Order ID Comments User Context   This patient has a current code status but no historical code status.        IV Access:    Peripheral IV   Procedures and diagnostic studies:   No  results found.   Medical Consultants:    None.  Anti-Infectives:   None  Subjective:    Nicholas Gutierrez no complains  Objective:    Vitals:   05/12/17 0940 05/12/17 1304 05/12/17 2125 05/13/17 0531  BP: (!) 141/84 (!) 154/88 (!) 147/94 (!) 147/85  Pulse:  64 72 74  Resp:  14 20 18   Temp:  97.8 F (36.6 C) 98 F (36.7 C) 97.6 F (36.4 C)  TempSrc:  Oral Oral Oral  SpO2:  96% 96% 95%  Weight:    104 kg (229 lb 4.5 oz)  Height:        Intake/Output Summary (Last 24 hours) at 05/13/2017 0811 Last data filed at 05/13/2017 0600 Gross per 24 hour  Intake 3 ml  Output -  Net 3 ml   Filed Weights   05/11/17 1208 05/11/17 1554 05/13/17 0531  Weight: 105 kg (231 lb 7.7 oz) 105 kg (231 lb 7.7 oz) 104 kg (229 lb 4.5 oz)    Exam: General exam: In no acute distress. Respiratory system: Good air movement and clear to auscultation. Cardiovascular system: S1 & S2 heard, RRR. Gastrointestinal system: Abdomen is nondistended, soft and nontender.  Central nervous system: Alert and oriented. No focal neurological deficits. Extremities: trace edema Skin: No rashes, lesions or ulcers   Data Reviewed:    Labs: Basic Metabolic Panel: Recent Labs  Lab 05/11/17 1618 05/12/17 0431 05/13/17 0440  NA 137 137 137  K 4.3 3.9 4.1  CL 110 111 109  CO2 20* 20* 21*  GLUCOSE 138* 98  87  BUN 46* 50* 52*  CREATININE 4.68* 4.77* 4.62*  CALCIUM 7.9* 7.9* 8.0*  MG 2.4  --   --   PHOS 5.8*  --  5.5*   GFR Estimated Creatinine Clearance: 17 mL/min (A) (by C-G formula based on SCr of 4.62 mg/dL (H)). Liver Function Tests: Recent Labs  Lab 05/11/17 1618 05/13/17 0440  AST 26  --   ALT 17  --   ALKPHOS 100  --   BILITOT 0.6  --   PROT 5.7*  --   ALBUMIN 1.9* 1.9*   No results for input(s): LIPASE, AMYLASE in the last 168 hours. No results for input(s): AMMONIA in the last 168 hours. Coagulation profile Recent Labs  Lab 05/13/17 0440  INR 0.97    CBC: Recent Labs  Lab  05/11/17 1618  WBC 5.5  HGB 12.8*  HCT 37.4*  MCV 93.0  PLT 198   Cardiac Enzymes: No results for input(s): CKTOTAL, CKMB, CKMBINDEX, TROPONINI in the last 168 hours. BNP (last 3 results) No results for input(s): PROBNP in the last 8760 hours. CBG: Recent Labs  Lab 05/12/17 0851 05/12/17 1200 05/12/17 1619 05/12/17 2139 05/13/17 0734  GLUCAP 101* 122* 119* 130* 92   D-Dimer: No results for input(s): DDIMER in the last 72 hours. Hgb A1c: No results for input(s): HGBA1C in the last 72 hours. Lipid Profile: No results for input(s): CHOL, HDL, LDLCALC, TRIG, CHOLHDL, LDLDIRECT in the last 72 hours. Thyroid function studies: Recent Labs    05/12/17 1356  TSH 6.154*   Anemia work up: No results for input(s): VITAMINB12, FOLATE, FERRITIN, TIBC, IRON, RETICCTPCT in the last 72 hours. Sepsis Labs: Recent Labs  Lab 05/11/17 1618  WBC 5.5   Microbiology No results found for this or any previous visit (from the past 240 hour(s)).   Medications:   . amLODipine  5 mg Oral Daily  . carvedilol  12.5 mg Oral BID WC  . furosemide  40 mg Oral Daily  . heparin  5,000 Units Subcutaneous Q8H  . [START ON 05/14/2017] heparin injection (subcutaneous)  5,000 Units Subcutaneous Q8H  . hydrALAZINE  100 mg Oral Q8H  . insulin aspart protamine- aspart  12 Units Subcutaneous BID WC  . isosorbide mononitrate  90 mg Oral Daily  . sodium chloride flush  3 mL Intravenous Q12H  . sodium chloride flush  3 mL Intravenous Q12H   Continuous Infusions: . sodium chloride       LOS: 2 days   Marinda Elk  [facility] Pager [phone]  *Please refer to [email or web address], password [email or web address] to get updated schedule on who will round on this patient, as hospitalists switch teams weekly. If 7PM-7AM, please contact night-coverage at [email or web address], password [email or web address] for any overnight needs.  05/13/2017, 8:11 AM

## 2017-05-13 NOTE — Progress Notes (Signed)
Patient ID: Nicholas Gutierrez, male   DOB: 12-28-1950, 67 y.o.   MRN: [id number] S:Feels better O:BP 109/66 (BP Location: Right Arm)   Pulse 64   Temp 97.8 F (36.6 C) (Oral)   Resp 18   Ht 5\' 2"  (1.575 m)   Wt 104 kg (229 lb 4.5 oz)   SpO2 97%   BMI 41.94 kg/m   Intake/Output Summary (Last 24 hours) at 05/13/2017 1344 Last data filed at 05/13/2017 1000 Gross per 24 hour  Intake 120 ml  Output -  Net 120 ml   Intake/Output: I/O last 3 completed shifts: In: 6 [I.V.:6] Out: -   Intake/Output this shift:  Total I/O In: 120 [P.O.:120] Out: -  Weight change: -1 kg (-3.3 oz) Gen: NAD CVS: no rub Resp: bibasilar crackles Abd: benign Ext: 1+ pitting edema  Recent Labs  Lab 05/11/17 1618 05/12/17 0431 05/13/17 0440  NA 137 137 137  K 4.3 3.9 4.1  CL 110 111 109  CO2 20* 20* 21*  GLUCOSE 138* 98 87  BUN 46* 50* 52*  CREATININE 4.68* 4.77* 4.62*  ALBUMIN 1.9*  --  1.9*  CALCIUM 7.9* 7.9* 8.0*  PHOS 5.8*  --  5.5*  AST 26  --   --   ALT 17  --   --    Liver Function Tests: Recent Labs  Lab 05/11/17 1618 05/13/17 0440  AST 26  --   ALT 17  --   ALKPHOS 100  --   BILITOT 0.6  --   PROT 5.7*  --   ALBUMIN 1.9* 1.9*   No results for input(s): LIPASE, AMYLASE in the last 168 hours. No results for input(s): AMMONIA in the last 168 hours. CBC: Recent Labs  Lab 05/11/17 1618  WBC 5.5  HGB 12.8*  HCT 37.4*  MCV 93.0  PLT 198   Cardiac Enzymes: No results for input(s): CKTOTAL, CKMB, CKMBINDEX, TROPONINI in the last 168 hours. CBG: Recent Labs  Lab 05/12/17 1200 05/12/17 1619 05/12/17 2139 05/13/17 0734 05/13/17 1147  GLUCAP 122* 119* 130* 92 122*    Iron Studies: No results for input(s): IRON, TIBC, TRANSFERRIN, FERRITIN in the last 72 hours. Studies/Results: No results found. Marland Kitchen amLODipine  5 mg Oral Daily  . carvedilol  12.5 mg Oral BID WC  . furosemide  40 mg Oral Daily  . heparin  5,000 Units Subcutaneous Q8H  . [START ON 05/14/2017] heparin injection  (subcutaneous)  5,000 Units Subcutaneous Q8H  . hydrALAZINE  100 mg Oral Q8H  . insulin aspart protamine- aspart  12 Units Subcutaneous BID WC  . isosorbide mononitrate  90 mg Oral Daily  . polyethylene glycol  17 g Oral Daily  . sodium chloride flush  3 mL Intravenous Q12H  . sodium chloride flush  3 mL Intravenous Q12H    BMET    Component Value Date/Time   NA 137 05/13/2017 0440   K 4.1 05/13/2017 0440   CL 109 05/13/2017 0440   CO2 21 (L) 05/13/2017 0440   GLUCOSE 87 05/13/2017 0440   BUN 52 (H) 05/13/2017 0440   CREATININE 4.62 (H) 05/13/2017 0440   CALCIUM 8.0 (L) 05/13/2017 0440   GFRNONAA 12 (L) 05/13/2017 0440   GFRAA 14 (L) 05/13/2017 0440   CBC    Component Value Date/Time   WBC 5.5 05/11/2017 1618   RBC 4.02 (L) 05/11/2017 1618   HGB 12.8 (L) 05/11/2017 1618   HCT 37.4 (L) 05/11/2017 1618   PLT 198 05/11/2017 1618  MCV 93.0 05/11/2017 1618   MCH 31.8 05/11/2017 1618   MCHC 34.2 05/11/2017 1618   RDW 13.5 05/11/2017 1618      Assessment/Plan: 1.  ARF vs subacute (no prior labs to review progression)-  Possibly related to nephrotic syndrome and ATN but need UA and r/o acute GN.  Discussed his labs and the need for a renal biopsy through an interpreter and he and his wife are amenable to proceed.  Have sent serologies and will consult IR for renal biopsy 2. Acute diastolic CHF- ECHO performed at OSH.  Diuresing well. 3. Nephrotic syndrome with Hypoalbuminemia- Will need to quantify 24 hour urine for protein and proceed with renal biopsy tomorrow.   1. Complements wnl, negative ASO, other serologies pending.  4. Hypertensive urgency- improved off of nipride.  Continue with coreg, hydralazine, and lasix 5. Adrenal mass- will check renin/aldo levels which may explain his hypertensive urgency. 6. Urinary retention- as noted by primary.  Will need to follow bladder scans and may require I&O catheter. 7. DM- per primary 8. Obesity 9. Elevated D-Dimer, negative VQ  scan    Irena CordsJoseph A. Binyomin Brann, MD [facility] [phone]

## 2017-05-14 ENCOUNTER — Inpatient Hospital Stay (HOSPITAL_COMMUNITY): Payer: Commercial Managed Care - PPO

## 2017-05-14 LAB — RENAL FUNCTION PANEL
ANION GAP: 7 (ref 5–15)
Albumin: 1.9 g/dL — ABNORMAL LOW (ref 3.5–5.0)
BUN: 53 mg/dL — ABNORMAL HIGH (ref 6–20)
CO2: 22 mmol/L (ref 22–32)
Calcium: 7.9 mg/dL — ABNORMAL LOW (ref 8.9–10.3)
Chloride: 107 mmol/L (ref 101–111)
Creatinine, Ser: 4.73 mg/dL — ABNORMAL HIGH (ref 0.61–1.24)
GFR calc Af Amer: 14 mL/min — ABNORMAL LOW (ref >60)
GFR, EST NON AFRICAN AMERICAN: 12 mL/min — AB (ref >60)
GLUCOSE: 112 mg/dL — AB (ref 65–99)
PHOSPHORUS: 5.7 mg/dL — AB (ref 2.5–4.6)
POTASSIUM: 4.2 mmol/L (ref 3.5–5.1)
Sodium: 136 mmol/L (ref 135–145)

## 2017-05-14 LAB — KAPPA/LAMBDA LIGHT CHAINS
KAPPA, LAMDA LIGHT CHAIN RATIO: 1.06 (ref 0.26–1.65)
Kappa free light chain: 126.6 mg/L — ABNORMAL HIGH (ref 3.3–19.4)
LAMDA FREE LIGHT CHAINS: 118.9 mg/L — AB (ref 5.7–26.3)

## 2017-05-14 LAB — ANTI-DNA ANTIBODY, DOUBLE-STRANDED: ds DNA Ab: 1 IU/mL (ref 0–9)

## 2017-05-14 LAB — CBC
HEMATOCRIT: 35.5 % — AB (ref 39.0–52.0)
HEMOGLOBIN: 12.1 g/dL — AB (ref 13.0–17.0)
MCH: 31.8 pg (ref 26.0–34.0)
MCHC: 34.1 g/dL (ref 30.0–36.0)
MCV: 93.2 fL (ref 78.0–100.0)
Platelets: 220 10*3/uL (ref 150–400)
RBC: 3.81 MIL/uL — ABNORMAL LOW (ref 4.22–5.81)
RDW: 13.5 % (ref 11.5–15.5)
WBC: 5.8 10*3/uL (ref 4.0–10.5)

## 2017-05-14 LAB — GLUCOSE, CAPILLARY
GLUCOSE-CAPILLARY: 103 mg/dL — AB (ref 65–99)
GLUCOSE-CAPILLARY: 114 mg/dL — AB (ref 65–99)
Glucose-Capillary: 112 mg/dL — ABNORMAL HIGH (ref 65–99)
Glucose-Capillary: 108 mg/dL — ABNORMAL HIGH (ref 65–99)

## 2017-05-14 LAB — PROTEIN ELECTROPHORESIS, SERUM
A/G RATIO SPE: 0.6 — AB (ref 0.7–1.7)
ALBUMIN ELP: 1.9 g/dL — AB (ref 2.9–4.4)
ALPHA-1-GLOBULIN: 0.2 g/dL (ref 0.0–0.4)
ALPHA-2-GLOBULIN: 1.2 g/dL — AB (ref 0.4–1.0)
Beta Globulin: 0.8 g/dL (ref 0.7–1.3)
GLOBULIN, TOTAL: 3.2 g/dL (ref 2.2–3.9)
Gamma Globulin: 1 g/dL (ref 0.4–1.8)
Total Protein ELP: 5.1 g/dL — ABNORMAL LOW (ref 6.0–8.5)

## 2017-05-14 LAB — PROTEIN, URINE, 24 HOUR
COLLECTION INTERVAL-UPROT: 24 h
PROTEIN, 24H URINE: 9113 mg/d — AB (ref 50–100)
URINE TOTAL VOLUME-UPROT: 1350 mL

## 2017-05-14 LAB — ANTINUCLEAR ANTIBODIES, IFA: ANA Ab, IFA: NEGATIVE

## 2017-05-14 LAB — GLOMERULAR BASEMENT MEMBRANE ANTIBODIES: GBM Ab: 2 units (ref 0–20)

## 2017-05-14 MED ORDER — FENTANYL CITRATE (PF) 100 MCG/2ML IJ SOLN
INTRAMUSCULAR | Status: AC
  Filled 2017-05-14: qty 2

## 2017-05-14 MED ORDER — FENTANYL CITRATE (PF) 100 MCG/2ML IJ SOLN
INTRAMUSCULAR | Status: AC | PRN
  Administered 2017-05-14: 50 ug via INTRAVENOUS

## 2017-05-14 MED ORDER — MIDAZOLAM HCL 2 MG/2ML IJ SOLN
INTRAMUSCULAR | Status: AC | PRN
  Administered 2017-05-14 (×2): 1 mg via INTRAVENOUS

## 2017-05-14 MED ORDER — MIDAZOLAM HCL 2 MG/2ML IJ SOLN
INTRAMUSCULAR | Status: AC
  Filled 2017-05-14: qty 4

## 2017-05-14 MED ORDER — LIDOCAINE HCL 1 % IJ SOLN
INTRAMUSCULAR | Status: AC
  Filled 2017-05-14: qty 20

## 2017-05-14 NOTE — Progress Notes (Signed)
[facility] PROGRESS NOTE    Progress Note  Nicholas Gutierrez  NWG:[id number] DOB: 1951/03/22 DOA: 05/11/2017 PCP: Patient, No Pcp Per     Brief Narrative:   Nicholas Gutierrez is an 67 y.o. male past medical history of diabetes, hypertension medication noncompliance and obesity who presented on December 26 to [facility] with progressive shortness of breath and lower extremity swelling which began 3 weeks prior to that admission, placed on night pride and transferred to [facility] hospital for workup of her acute renal failure.  Assessment/Plan:   Acute kidney injury: Unclear of etiology, nephrology services following. Recommended renal biopsy, they are concerned about nephrotic syndrome and ATN. Schedule for 1.2.2019. 24-hour urine protein pending.  Hypertensive urgency: Initially requiring IV drip, now off. continue Coreg, Imdur, Lasix and hydralazine.  Diabetes mellitus type 2 with nephropathy: With an A1c of 6.6, Continue 70/30 stop sliding scale insulin. Hold 70/30 for procedurey, resume once diet is resume.  Morbid obesity:  Acute diastolic heart failure: Likely due to essential hypertension: Cont oral lasix, strict I's and O's Daily weight. Replete potassium orally.  Elevated d-dimer: Likely due to renal failure VQ scan negative she denies any chest pain.  BPH: With urinary obstruction Foley now removed able to void spontaneously.  Adrenal mass incidentaloma: Checking renon/aldo level.   DVT prophylaxis: lovenox Family Communication:son Disposition Plan/Barrier to D/C: unable to determine Code Status:     Code Status Orders  (From admission, onward)        Start     Ordered   05/11/17 1615  Full code  Continuous     05/11/17 1614    Code Status History    Date Active Date Inactive Code Status Order ID Comments User Context   This patient has a current code status but no historical code status.        IV Access:    Peripheral  IV   Procedures and diagnostic studies:   No results found.   Medical Consultants:    None.  Anti-Infectives:   None  Subjective:    Nicholas Gutierrez no complains  Objective:    Vitals:   05/13/17 0531 05/13/17 1250 05/13/17 2156 05/14/17 0543  BP: (!) 147/85 109/66 123/76 133/81  Pulse: 74 64 64 64  Resp: 18 18 18 16   Temp: 97.6 F (36.4 C) 97.8 F (36.6 C) 98.3 F (36.8 C) 98 F (36.7 C)  TempSrc: Oral Oral Oral Oral  SpO2: 95% 97% 98% 96%  Weight: 104 kg (229 lb 4.5 oz)   103.6 kg (228 lb 4.8 oz)  Height:        Intake/Output Summary (Last 24 hours) at 05/14/2017 0946 Last data filed at 05/14/2017 0554 Gross per 24 hour  Intake 120 ml  Output 1400 ml  Net -1280 ml   Filed Weights   05/11/17 1554 05/13/17 0531 05/14/17 0543  Weight: 105 kg (231 lb 7.7 oz) 104 kg (229 lb 4.5 oz) 103.6 kg (228 lb 4.8 oz)    Exam: General exam: In no acute distress. Respiratory system: Good air movement and clear to auscultation. Cardiovascular system: S1 & S2 heard, RRR. Gastrointestinal system: Abdomen is nondistended, soft and nontender.  Central nervous system: Alert and oriented. No focal neurological deficits. Extremities: trace edema Skin: No rashes, lesions or ulcers   Data Reviewed:    Labs: Basic Metabolic Panel: Recent Labs  Lab 05/11/17 1618 05/12/17 0431 05/13/17 0440 05/14/17 0402  NA 137 137 137 136  K 4.3 3.9 4.1  4.2  CL 110 111 109 107  CO2 20* 20* 21* 22  GLUCOSE 138* 98 87 112*  BUN 46* 50* 52* 53*  CREATININE 4.68* 4.77* 4.62* 4.73*  CALCIUM 7.9* 7.9* 8.0* 7.9*  MG 2.4  --   --   --   PHOS 5.8*  --  5.5* 5.7*   GFR Estimated Creatinine Clearance: 16.6 mL/min (A) (by C-G formula based on SCr of 4.73 mg/dL (H)). Liver Function Tests: Recent Labs  Lab 05/11/17 1618 05/13/17 0440 05/14/17 0402  AST 26  --   --   ALT 17  --   --   ALKPHOS 100  --   --   BILITOT 0.6  --   --   PROT 5.7*  --   --   ALBUMIN 1.9* 1.9* 1.9*   No  results for input(s): LIPASE, AMYLASE in the last 168 hours. No results for input(s): AMMONIA in the last 168 hours. Coagulation profile Recent Labs  Lab 05/13/17 0440  INR 0.97    CBC: Recent Labs  Lab 05/11/17 1618 05/14/17 0402  WBC 5.5 5.8  HGB 12.8* 12.1*  HCT 37.4* 35.5*  MCV 93.0 93.2  PLT 198 220   Cardiac Enzymes: No results for input(s): CKTOTAL, CKMB, CKMBINDEX, TROPONINI in the last 168 hours. BNP (last 3 results) No results for input(s): PROBNP in the last 8760 hours. CBG: Recent Labs  Lab 05/13/17 0734 05/13/17 1147 05/13/17 1650 05/13/17 2159 05/14/17 0743  GLUCAP 92 122* 86 99 112*   D-Dimer: No results for input(s): DDIMER in the last 72 hours. Hgb A1c: No results for input(s): HGBA1C in the last 72 hours. Lipid Profile: No results for input(s): CHOL, HDL, LDLCALC, TRIG, CHOLHDL, LDLDIRECT in the last 72 hours. Thyroid function studies: Recent Labs    05/12/17 1356  TSH 6.154*   Anemia work up: No results for input(s): VITAMINB12, FOLATE, FERRITIN, TIBC, IRON, RETICCTPCT in the last 72 hours. Sepsis Labs: Recent Labs  Lab 05/11/17 1618 05/14/17 0402  WBC 5.5 5.8   Microbiology No results found for this or any previous visit (from the past 240 hour(s)).   Medications:   . amLODipine  5 mg Oral Daily  . carvedilol  12.5 mg Oral BID WC  . furosemide  40 mg Oral Daily  . heparin injection (subcutaneous)  5,000 Units Subcutaneous Q8H  . hydrALAZINE  100 mg Oral Q8H  . insulin aspart protamine- aspart  12 Units Subcutaneous BID WC  . isosorbide mononitrate  90 mg Oral Daily  . polyethylene glycol  17 g Oral Daily  . sodium chloride flush  3 mL Intravenous Q12H  . sodium chloride flush  3 mL Intravenous Q12H   Continuous Infusions: . sodium chloride       LOS: 3 days   [name]  [facility] Pager [phone]  *Please refer to [email or web address], password [email or web address] to get updated schedule on who will round on this patient, as  hospitalists switch teams weekly. If 7PM-7AM, please contact night-coverage at [email or web address], password [email or web address] for any overnight needs.  05/14/2017, 9:46 AM

## 2017-05-14 NOTE — Procedures (Signed)
Renal failure  S/p US rt renal bx  No comp Stable EBL 0 Path pending Full report in pacs

## 2017-05-14 NOTE — Progress Notes (Signed)
Patient ID: Nicholas Gutierrez, male   DOB: 08/07/1950, 67 y.o.   MRN: [id number] S:No new complaints O:BP 116/63   Pulse 74   Temp 98 F (36.7 C) (Oral)   Resp 12   Ht 5\' 2"  (1.575 m)   Wt 103.6 kg (228 lb 4.8 oz)   SpO2 100%   BMI 41.76 kg/m   Intake/Output Summary (Last 24 hours) at 05/14/2017 1313 Last data filed at 05/14/2017 1200 Gross per 24 hour  Intake -  Output 1700 ml  Net -1700 ml   Intake/Output: I/O last 3 completed shifts: In: 120 [P.O.:120] Out: 1400 [Urine:1400]  Intake/Output this shift:  Total I/O In: -  Out: 300 [Urine:300] Weight change: -0.444 kg (-15.7 oz) Gen: NAD CVS: no rub Resp: bibasilar crackles Abd: benign Ext: 2+ edema  Recent Labs  Lab 05/11/17 1618 05/12/17 0431 05/13/17 0440 05/14/17 0402  NA 137 137 137 136  K 4.3 3.9 4.1 4.2  CL 110 111 109 107  CO2 20* 20* 21* 22  GLUCOSE 138* 98 87 112*  BUN 46* 50* 52* 53*  CREATININE 4.68* 4.77* 4.62* 4.73*  ALBUMIN 1.9*  --  1.9* 1.9*  CALCIUM 7.9* 7.9* 8.0* 7.9*  PHOS 5.8*  --  5.5* 5.7*  AST 26  --   --   --   ALT 17  --   --   --    Liver Function Tests: Recent Labs  Lab 05/11/17 1618 05/13/17 0440 05/14/17 0402  AST 26  --   --   ALT 17  --   --   ALKPHOS 100  --   --   BILITOT 0.6  --   --   PROT 5.7*  --   --   ALBUMIN 1.9* 1.9* 1.9*   No results for input(s): LIPASE, AMYLASE in the last 168 hours. No results for input(s): AMMONIA in the last 168 hours. CBC: Recent Labs  Lab 05/11/17 1618 05/14/17 0402  WBC 5.5 5.8  HGB 12.8* 12.1*  HCT 37.4* 35.5*  MCV 93.0 93.2  PLT 198 220   Cardiac Enzymes: No results for input(s): CKTOTAL, CKMB, CKMBINDEX, TROPONINI in the last 168 hours. CBG: Recent Labs  Lab 05/13/17 1147 05/13/17 1650 05/13/17 2159 05/14/17 0743 05/14/17 1139  GLUCAP 122* 86 99 112* 108*    Iron Studies: No results for input(s): IRON, TIBC, TRANSFERRIN, FERRITIN in the last 72 hours. Studies/Results: No results found. . fentaNYL      . lidocaine       . midazolam      . amLODipine  5 mg Oral Daily  . carvedilol  12.5 mg Oral BID WC  . furosemide  40 mg Oral Daily  . heparin injection (subcutaneous)  5,000 Units Subcutaneous Q8H  . hydrALAZINE  100 mg Oral Q8H  . insulin aspart protamine- aspart  12 Units Subcutaneous BID WC  . isosorbide mononitrate  90 mg Oral Daily  . polyethylene glycol  17 g Oral Daily  . sodium chloride flush  3 mL Intravenous Q12H  . sodium chloride flush  3 mL Intravenous Q12H    BMET    Component Value Date/Time   NA 136 05/14/2017 0402   K 4.2 05/14/2017 0402   CL 107 05/14/2017 0402   CO2 22 05/14/2017 0402   GLUCOSE 112 (H) 05/14/2017 0402   BUN 53 (H) 05/14/2017 0402   CREATININE 4.73 (H) 05/14/2017 0402   CALCIUM 7.9 (L) 05/14/2017 0402   GFRNONAA 12 (L) 05/14/2017 0402  GFRAA 14 (L) 05/14/2017 0402   CBC    Component Value Date/Time   WBC 5.8 05/14/2017 0402   RBC 3.81 (L) 05/14/2017 0402   HGB 12.1 (L) 05/14/2017 0402   HCT 35.5 (L) 05/14/2017 0402   PLT 220 05/14/2017 0402   MCV 93.2 05/14/2017 0402   MCH 31.8 05/14/2017 0402   MCHC 34.1 05/14/2017 0402   RDW 13.5 05/14/2017 0402    Assessment/Plan: 1. ARF vs subacute (no prior labs to review progression)- Possibly related to nephrotic syndrome and ATN but need UA and r/o acute GN.  1. s/p renal biopsy 05/14/17 2. serologies pending  3. Appreciate IR assistance with renal biopsy 2. Acute diastolic CHF- ECHO performed at OSH. Diuresing well. 3. Nephrotic syndrome with Hypoalbuminemia- Will need to quantify 24 hour urine for protein and proceed with renal biopsy tomorrow.   1. Complements wnl, negative ASO, anti-GBM,  DsDNA, ANA, other serologies pending.  4. Hypertensive urgency- improved off of nipride. Continue with coreg, hydralazine, and lasix 5. Adrenal mass- renin/aldo levels pending (which may explain his hypertensive urgency). 6. Urinary retention- as noted by primary. Will need to follow bladder scans and may  require I&O catheter. 7. DM- per primary 8. Obesity 9. Elevated D-Dimer, negative VQ scan    Donetta Potts, MD [facility] [phone]

## 2017-05-15 ENCOUNTER — Encounter (HOSPITAL_COMMUNITY): Payer: Self-pay

## 2017-05-15 ENCOUNTER — Inpatient Hospital Stay (HOSPITAL_COMMUNITY): Payer: Commercial Managed Care - PPO

## 2017-05-15 ENCOUNTER — Other Ambulatory Visit: Payer: Self-pay

## 2017-05-15 LAB — RENAL FUNCTION PANEL
ALBUMIN: 1.8 g/dL — AB (ref 3.5–5.0)
Anion gap: 7 (ref 5–15)
BUN: 57 mg/dL — AB (ref 6–20)
CALCIUM: 7.9 mg/dL — AB (ref 8.9–10.3)
CO2: 21 mmol/L — AB (ref 22–32)
CREATININE: 4.97 mg/dL — AB (ref 0.61–1.24)
Chloride: 107 mmol/L (ref 101–111)
GFR calc Af Amer: 13 mL/min — ABNORMAL LOW (ref >60)
GFR calc non Af Amer: 11 mL/min — ABNORMAL LOW (ref >60)
GLUCOSE: 82 mg/dL (ref 65–99)
PHOSPHORUS: 6.3 mg/dL — AB (ref 2.5–4.6)
Potassium: 4.5 mmol/L (ref 3.5–5.1)
Sodium: 135 mmol/L (ref 135–145)

## 2017-05-15 LAB — GLUCOSE, CAPILLARY
GLUCOSE-CAPILLARY: 164 mg/dL — AB (ref 65–99)
Glucose-Capillary: 86 mg/dL (ref 65–99)
Glucose-Capillary: 112 mg/dL — ABNORMAL HIGH (ref 65–99)
Glucose-Capillary: 147 mg/dL — ABNORMAL HIGH (ref 65–99)

## 2017-05-15 LAB — MPO/PR-3 (ANCA) ANTIBODIES
ANCA Proteinase 3: <3.5 U/mL (ref 0.0–3.5)
Myeloperoxidase Abs: <9 U/mL (ref 0.0–9.0)

## 2017-05-15 LAB — IMMUNOFIXATION, URINE

## 2017-05-15 NOTE — Progress Notes (Signed)
Assessment/Plan: 1. Renal Failure, prob chronic GN--await bx results will also check renal ultrasound to eval size of kidneys 1. s/p renal biopsy 05/14/17 2. serologies neg 2. Acute diastolic CHF- ECHO performed at OSH. Diuresing well. 3. Nephrotic syndrome withHypoalbuminemia- Will f/u renal biopsy  4. Hypertensive urgency- improved  5. Adrenal mass-  6. Urinary retention-   Subjective: Interval History: no complaints  Objective: Vital signs in last 24 hours: Temp:  [98.2 F (36.8 C)-98.5 F (36.9 C)] 98.2 F (36.8 C) (01/03 0506) Pulse Rate:  [64-74] 69 (01/03 0506) Resp:  [7-18] 18 (01/03 0506) BP: (102-134)/(56-76) 121/71 (01/03 0506) SpO2:  [93 %-100 %] 93 % (01/03 0506) FiO2 (%):  [2 %] 2 % (01/02 1443) Weight:  [102.3 kg (225 lb 9.6 oz)] 102.3 kg (225 lb 9.6 oz) (01/03 0506) Weight change: -1.225 kg (-11.2 oz)  Intake/Output from previous day: 01/02 0701 - 01/03 0700 In: -  Out: 300 [Urine:300] Intake/Output this shift: No intake/output data recorded.  General appearance: alert and cooperative Chest wall: no tenderness Cardio: regular rate and rhythm, S1, S2 normal, no murmur, click, rub or gallop Extremities: edema 1+  Presacral tr to 1+  Lab Results: Recent Labs    05/14/17 0402  WBC 5.8  HGB 12.1*  HCT 35.5*  PLT 220   BMET:  Recent Labs    05/14/17 0402 05/15/17 0404  NA 136 135  K 4.2 4.5  CL 107 107  CO2 22 21*  GLUCOSE 112* 82  BUN 53* 57*  CREATININE 4.73* 4.97*  CALCIUM 7.9* 7.9*   Recent Labs    05/12/17 1356  PTH 118*   Iron Studies: No results for input(s): IRON, TIBC, TRANSFERRIN, FERRITIN in the last 72 hours. Studies/Results: [place] Biopsy (kidney)  Result Date: 05/14/2017 INDICATION: Acute renal failure, diabetes, hypertension EXAM: ULTRASOUND RIGHT RENAL BIOPSY MEDICATIONS: 1% LIDOCAINE LOCAL ANESTHESIA/SEDATION: Moderate (conscious) sedation was employed during this procedure. A total of Versed 2.0 mg and Fentanyl 50 mcg was  administered intravenously. Moderate Sedation Time: 10 minutes. The patient's level of consciousness and vital signs were monitored continuously by radiology nursing throughout the procedure under my direct supervision. FLUOROSCOPY TIME:  Fluoroscopy Time: NONE. COMPLICATIONS: None immediate. PROCEDURE: Informed written consent was obtained from the patient after a thorough discussion of the procedural risks, benefits and alternatives. All questions were addressed. Maximal Sterile Barrier Technique was utilized including caps, mask, sterile gowns, sterile gloves, sterile drape, hand hygiene and skin antiseptic. A timeout was performed prior to the initiation of the procedure. Patient positioned prone. Preliminary ultrasound performed. Right kidney is slightly more visible for ultrasound percutaneous access. Overlying skin marked. Under sterile conditions and local anesthesia, a 15 gauge coaxial guide needle was advanced to the right kidney lower pole. Needle position confirmed with ultrasound. 2 16 gauge core biopsies obtained through the access. Samples were intact and non fragmented. These were placed in saline. Needle removed. Postprocedure imaging demonstrates no hemorrhage or hematoma. Patient on the biopsy well. IMPRESSION: Successful ultrasound right kidney core biopsy. Electronically Signed   By: Judie PetitM.  Shick M.D.   On: 05/14/2017 13:38    Scheduled: . amLODipine  5 mg Oral Daily  . carvedilol  12.5 mg Oral BID WC  . furosemide  40 mg Oral Daily  . heparin injection (subcutaneous)  5,000 Units Subcutaneous Q8H  . hydrALAZINE  100 mg Oral Q8H  . insulin aspart protamine- aspart  12 Units Subcutaneous BID WC  . isosorbide mononitrate  90 mg Oral Daily  .  polyethylene glycol  17 g Oral Daily  . sodium chloride flush  3 mL Intravenous Q12H  . sodium chloride flush  3 mL Intravenous Q12H   Continuous: . sodium chloride        LOS: 4 days   Lauris Poag 05/15/2017,8:29 AM

## 2017-05-15 NOTE — Care Management Note (Signed)
Case Management Note  Patient Details  Name: Nicholas Gutierrez MRN: [id number] Date of Birth: June 07, 1950  Subjective/Objective:    Hypertensive                Action/Plan: Plan to discharge home with no needs   Expected Discharge Date:                  Expected Discharge Plan:  Home/Self Care  In-House Referral:     Discharge planning Services  CM Consult  Post Acute Care Choice:    Choice offered to:     DME Arranged:    DME Agency:     HH Arranged:    HH Agency:     Status of Service:  In process, will continue to follow  If discussed at Long Length of Stay Meetings, dates discussed:    Additional CommentsGeni Bers:  Zahlia Deshazer, RN 05/15/2017, 3:13 PM

## 2017-05-15 NOTE — Progress Notes (Signed)
PROGRESS NOTE    Tranell Wojtkiewicz  RUE:[id number] DOB: 10-03-1950 DOA: 05/11/2017 PCP: Patient, No Pcp Per Brief Narrative: 67 y.o. male past medical history of diabetes, hypertension medication noncompliance and obesity who presented on December 26 to [facility] with progressive shortness of breath and lower extremity swelling which began 3 weeks prior to that admission, placed on night pride and transferred to [facility] long hospital for workup of her acute renal failure 05/15/2017 -reports his daughter 22 yo is on dialysis due to type 1 dm.  Assessment & Plan:   Active Problems:   Hypertensive urgency   Type 2 diabetes with nephropathy (HCC)   Obesity   Acute diastolic CHF (congestive heart failure) (HCC)   Elevated d-dimer   BPH with urinary obstruction   Adrenal mass (HCC)   Acute renal arterial thrombosis (HCC)  Acute kidney injury: Unclear of etiology, nephrology services following. S/P RENAL BIOPSY 05/14/2017 24-hour urine protein pending.renal ultrasound done results pending.  Hypertensive urgency: Initially requiring IV drip, now off. continue Coreg, Imdur, Lasix and hydralazine.  Diabetes mellitus type 2 with nephropathy: With an A1c of 6.6, Continue 70/30 stop sliding scale insulin   Acute diastolic heart failure: Likely due to essential hypertension: Cont oral lasix, strict I's and O's Daily weight. Replete potassium orally.     DVT prophylaxis:LOVENOX Code Status:FULL Family Communication DW WIFE WITH interpretoer Disposition Plan:  tbd Consultants: renal  Procedures:renal biopsy Antimicrobials: none Subjective:feels okay.   Objective:resting in bed in nad Vitals:   05/14/17 1443 05/14/17 2327 05/15/17 0506 05/15/17 1015  BP: 134/76 122/75 121/71 111/65  Pulse: 67 68 69 66  Resp: 16 18 18    Temp:  98.5 F (36.9 C) 98.2 F (36.8 C)   TempSrc:  Oral Oral   SpO2: 100% 93% 93%   Weight:   102.3 kg (225 lb 9.6 oz)   Height:         Intake/Output Summary (Last 24 hours) at 05/15/2017 1426 Last data filed at 05/15/2017 1300 Gross per 24 hour  Intake 600 ml  Output 500 ml  Net 100 ml   Filed Weights   05/13/17 0531 05/14/17 0543 05/15/17 0506  Weight: 104 kg (229 lb 4.5 oz) 103.6 kg (228 lb 4.8 oz) 102.3 kg (225 lb 9.6 oz)    Examination:  General exam: Appears calm and comfortable  Respiratory system: Clear to auscultation. Respiratory effort normal. Cardiovascular system: S1 & S2 heard, RRR. No JVD, murmurs, rubs, gallops or clicks. No pedal edema. Gastrointestinal system: Abdomen is nondistended, soft and nontender. No organomegaly or masses felt. Normal bowel sounds heard. Central nervous system: Alert and oriented. No focal neurological deficits. Extremities: Symmetric 5 x 5 power. Skin: No rashes, lesions or ulcers Psychiatry: Judgement and insight appear normal. Mood & affect appropriate.     Data Reviewed: I have personally reviewed following labs and imaging studies  CBC: Recent Labs  Lab 05/11/17 1618 05/14/17 0402  WBC 5.5 5.8  HGB 12.8* 12.1*  HCT 37.4* 35.5*  MCV 93.0 93.2  PLT 198 220   Basic Metabolic Panel: Recent Labs  Lab 05/11/17 1618 05/12/17 0431 05/13/17 0440 05/14/17 0402 05/15/17 0404  NA 137 137 137 136 135  K 4.3 3.9 4.1 4.2 4.5  CL 110 111 109 107 107  CO2 20* 20* 21* 22 21*  GLUCOSE 138* 98 87 112* 82  BUN 46* 50* 52* 53* 57*  CREATININE 4.68* 4.77* 4.62* 4.73* 4.97*  CALCIUM 7.9* 7.9* 8.0* 7.9* 7.9*  MG 2.4  --   --   --   --  PHOS 5.8*  --  5.5* 5.7* 6.3*   GFR: Estimated Creatinine Clearance: 15.7 mL/min (A) (by C-G formula based on SCr of 4.97 mg/dL (H)). Liver Function Tests: Recent Labs  Lab 05/11/17 1618 05/13/17 0440 05/14/17 0402 05/15/17 0404  AST 26  --   --   --   ALT 17  --   --   --   ALKPHOS 100  --   --   --   BILITOT 0.6  --   --   --   PROT 5.7*  --   --   --   ALBUMIN 1.9* 1.9* 1.9* 1.8*   No results for input(s): LIPASE,  AMYLASE in the last 168 hours. No results for input(s): AMMONIA in the last 168 hours. Coagulation Profile: Recent Labs  Lab 05/13/17 0440  INR 0.97   Cardiac Enzymes: No results for input(s): CKTOTAL, CKMB, CKMBINDEX, TROPONINI in the last 168 hours. BNP (last 3 results) No results for input(s): PROBNP in the last 8760 hours. HbA1C: No results for input(s): HGBA1C in the last 72 hours. CBG: Recent Labs  Lab 05/14/17 1139 05/14/17 1644 05/14/17 2112 05/15/17 0740 05/15/17 1153  GLUCAP 108* 103* 114* 86 112*   Lipid Profile: No results for input(s): CHOL, HDL, LDLCALC, TRIG, CHOLHDL, LDLDIRECT in the last 72 hours. Thyroid Function Tests: No results for input(s): TSH, T4TOTAL, FREET4, T3FREE, THYROIDAB in the last 72 hours. Anemia Panel: No results for input(s): VITAMINB12, FOLATE, FERRITIN, TIBC, IRON, RETICCTPCT in the last 72 hours. Sepsis Labs: No results for input(s): PROCALCITON, LATICACIDVEN in the last 168 hours.  No results found for this or any previous visit (from the past 240 hour(s)).       Radiology Studies: US Biopsy (kidney)  Result Date: 05/14/2017 INDICATION: Acute renal failure, diabetes, hypertension EXAM: ULTRASOUND RIGHT RENAL BIOPSY MEDICATIONS: 1% LIDOCAINE LOCAL ANESTHESIA/SEDATION: Moderate (conscious) sedation was employed during this procedure. A total of Versed 2.0 mg and Fentanyl 50 mcg was administered intravenously. Moderate Sedation Time: 10 minutes. The patient's level of consciousness and vital signs were monitored continuously by radiology nursing throughout the procedure under my direct supervision. FLUOROSCOPY TIME:  Fluoroscopy Time: NONE. COMPLICATIONS: None immediate. PROCEDURE: Informed written consent was obtained from the patient after a thorough discussion of the procedural risks, benefits and alternatives. All questions were addressed. Maximal Sterile Barrier Technique was utilized including caps, mask, sterile gowns, sterile  gloves, sterile drape, hand hygiene and skin antiseptic. A timeout was performed prior to the initiation of the procedure. Patient positioned prone. Preliminary ultrasound performed. Right kidney is slightly more visible for ultrasound percutaneous access. Overlying skin marked. Under sterile conditions and local anesthesia, a 15 gauge coaxial guide needle was advanced to the right kidney lower pole. Needle position confirmed with ultrasound. 2 16 gauge core biopsies obtained through the access. Samples were intact and non fragmented. These were placed in saline. Needle removed. Postprocedure imaging demonstrates no hemorrhage or hematoma. Patient on the biopsy well. IMPRESSION: Successful ultrasound right kidney core biopsy. Electronically Signed   By: Judie Petit.  Shick M.D.   On: 05/14/2017 13:38        Scheduled Meds: . amLODipine  5 mg Oral Daily  . carvedilol  12.5 mg Oral BID WC  . furosemide  40 mg Oral Daily  . heparin injection (subcutaneous)  5,000 Units Subcutaneous Q8H  . hydrALAZINE  100 mg Oral Q8H  . insulin aspart protamine- aspart  12 Units Subcutaneous BID WC  . isosorbide mononitrate  90 mg  Oral Daily  . polyethylene glycol  17 g Oral Daily  . sodium chloride flush  3 mL Intravenous Q12H  . sodium chloride flush  3 mL Intravenous Q12H   Continuous Infusions: . sodium chloride       LOS: 4 days      Alwyn RenElizabeth G Damar Petit,  If 7PM-7AM, please contact night-coverage [email or web address] Password [email or web address] 05/15/2017, 2:26 PM

## 2017-05-16 DIAGNOSIS — E118 Type 2 diabetes mellitus with unspecified complications: Secondary | ICD-10-CM

## 2017-05-16 DIAGNOSIS — N184 Chronic kidney disease, stage 4 (severe): Secondary | ICD-10-CM

## 2017-05-16 DIAGNOSIS — I10 Essential (primary) hypertension: Secondary | ICD-10-CM

## 2017-05-16 LAB — MAGNESIUM: Magnesium: 2.5 mg/dL — ABNORMAL HIGH (ref 1.7–2.4)

## 2017-05-16 LAB — COMPREHENSIVE METABOLIC PANEL
ALBUMIN: 2.1 g/dL — AB (ref 3.5–5.0)
ALK PHOS: 95 U/L (ref 38–126)
ALT: 14 U/L — AB (ref 17–63)
AST: 18 U/L (ref 15–41)
Anion gap: 6 (ref 5–15)
BILIRUBIN TOTAL: 0.8 mg/dL (ref 0.3–1.2)
BUN: 62 mg/dL — ABNORMAL HIGH (ref 6–20)
CALCIUM: 8.1 mg/dL — AB (ref 8.9–10.3)
CO2: 21 mmol/L — ABNORMAL LOW (ref 22–32)
CREATININE: 5.09 mg/dL — AB (ref 0.61–1.24)
Chloride: 107 mmol/L (ref 101–111)
GFR calc Af Amer: 13 mL/min — ABNORMAL LOW (ref >60)
GFR calc non Af Amer: 11 mL/min — ABNORMAL LOW (ref >60)
Glucose, Bld: 105 mg/dL — ABNORMAL HIGH (ref 65–99)
Potassium: 4.4 mmol/L (ref 3.5–5.1)
SODIUM: 134 mmol/L — AB (ref 135–145)
TOTAL PROTEIN: 5.6 g/dL — AB (ref 6.5–8.1)

## 2017-05-16 LAB — CBC
HEMATOCRIT: 36.4 % — AB (ref 39.0–52.0)
HEMOGLOBIN: 12 g/dL — AB (ref 13.0–17.0)
MCH: 31.1 pg (ref 26.0–34.0)
MCHC: 33 g/dL (ref 30.0–36.0)
MCV: 94.3 fL (ref 78.0–100.0)
Platelets: 220 10*3/uL (ref 150–400)
RBC: 3.86 MIL/uL — AB (ref 4.22–5.81)
RDW: 13.7 % (ref 11.5–15.5)
WBC: 6.1 10*3/uL (ref 4.0–10.5)

## 2017-05-16 LAB — RENAL FUNCTION PANEL
ANION GAP: 6 (ref 5–15)
Albumin: 2 g/dL — ABNORMAL LOW (ref 3.5–5.0)
BUN: 61 mg/dL — ABNORMAL HIGH (ref 6–20)
CALCIUM: 8.1 mg/dL — AB (ref 8.9–10.3)
CO2: 21 mmol/L — AB (ref 22–32)
Chloride: 107 mmol/L (ref 101–111)
Creatinine, Ser: 5.1 mg/dL — ABNORMAL HIGH (ref 0.61–1.24)
GFR calc non Af Amer: 11 mL/min — ABNORMAL LOW (ref >60)
GFR, EST AFRICAN AMERICAN: 13 mL/min — AB (ref >60)
Glucose, Bld: 104 mg/dL — ABNORMAL HIGH (ref 65–99)
PHOSPHORUS: 5.9 mg/dL — AB (ref 2.5–4.6)
POTASSIUM: 4.4 mmol/L (ref 3.5–5.1)
SODIUM: 134 mmol/L — AB (ref 135–145)

## 2017-05-16 LAB — GLUCOSE, CAPILLARY
Glucose-Capillary: 106 mg/dL — ABNORMAL HIGH (ref 65–99)
Glucose-Capillary: 127 mg/dL — ABNORMAL HIGH (ref 65–99)
Glucose-Capillary: 153 mg/dL — ABNORMAL HIGH (ref 65–99)
Glucose-Capillary: 133 mg/dL — ABNORMAL HIGH (ref 65–99)

## 2017-05-16 LAB — PHOSPHORUS: Phosphorus: 5.7 mg/dL — ABNORMAL HIGH (ref 2.5–4.6)

## 2017-05-16 MED ORDER — VITAMIN D (ERGOCALCIFEROL) 1.25 MG (50000 UNIT) PO CAPS
50000.0000 [IU] | ORAL_CAPSULE | ORAL | Status: DC
  Filled 2017-05-16: qty 1

## 2017-05-16 NOTE — Consult Note (Signed)
Patient name: Nicholas Gutierrez MRN: [id number] DOB: [birth year] Sex: male   REASON FOR CONSULT:    To evaluate for hemodialysis access.  The consult is requested by Dr. Casimiro Needle.   HPI:   Nicholas Gutierrez is a pleasant 67 y.o. male, who was transferred from [facility] on 05/11/2017 with shortness of breath swelling and worsening renal failure.  His renal insufficiency I believe is secondary to diabetes and hypertension.  Vascular surgery was consulted for hemodialysis access.  The patient is not yet on dialysis.  The patient requested that I used the translator as he does not speak [place] well.  History is obtained through the video interpreter.  The patient believes that his chronic kidney disease is secondary to diabetes and hypertension.  He denies any nausea or vomiting or anorexia.  He has had shortness of breath and some headache.  He is right-handed.  Past Medical History:  Diagnosis Date  . Diabetes (HCC)     History reviewed. No pertinent family history.  There is no family history of premature cardiovascular disease.  SOCIAL HISTORY: He is not a smoker. Social History   Socioeconomic History  . Marital status: Married    Spouse name: Not on file  . Number of children: Not on file  . Years of education: Not on file  . Highest education level: Not on file  Social Needs  . Financial resource strain: Not on file  . Food insecurity - worry: Not on file  . Food insecurity - inability: Not on file  . Transportation needs - medical: Not on file  . Transportation needs - non-medical: Not on file  Occupational History  . Occupation: factory  Tobacco Use  . Smoking status: Never Smoker  . Smokeless tobacco: Never Used  Substance and Sexual Activity  . Alcohol use: No    Frequency: Never  . Drug use: No  . Sexual activity: Not on file  Other Topics Concern  . Not on file  Social History Narrative  . Not on file    No Known Allergies  Current  Facility-Administered Medications  Medication Dose Route Frequency Provider Last Rate Last Dose  . 0.9 %  sodium chloride infusion  250 mL Intravenous PRN Vassie Loll, MD      . amLODipine (NORVASC) tablet 5 mg  5 mg Oral Daily Marinda Elk, MD   5 mg at 05/16/17 1020  . carvedilol (COREG) tablet 12.5 mg  12.5 mg Oral BID WC Vassie Loll, MD   12.5 mg at 05/16/17 0818  . furosemide (LASIX) tablet 40 mg  40 mg Oral Daily Vassie Loll, MD   40 mg at 05/16/17 1020  . heparin injection 5,000 Units  5,000 Units Subcutaneous Q8H Barnetta Chapel, PA-C   5,000 Units at 05/16/17 1540  . hydrALAZINE (APRESOLINE) tablet 100 mg  100 mg Oral Q8H Marinda Elk, MD   100 mg at 05/16/17 1540  . HYDROcodone-acetaminophen (NORCO/VICODIN) 5-325 MG per tablet 1 tablet  1 tablet Oral Q4H PRN Marinda Elk, MD      . insulin aspart protamine- aspart (NOVOLOG MIX 70/30) injection 12 Units  12 Units Subcutaneous BID WC Vassie Loll, MD   12 Units at 05/16/17 0818  . ipratropium-albuterol (DUONEB) 0.5-2.5 (3) MG/3ML nebulizer solution 3 mL  3 mL Nebulization Q6H PRN Vassie Loll, MD      . isosorbide mononitrate (IMDUR) 24 hr tablet 90 mg  90 mg Oral Daily Vassie Loll, MD   90  mg at 05/16/17 1020  . ondansetron (ZOFRAN) tablet 4 mg  4 mg Oral Q6H PRN Vassie LollMadera, Carlos, MD       Or  . ondansetron [facility](ZOFRAN) injection 4 mg  4 mg Intravenous Q6H PRN Vassie LollMadera, Carlos, MD      . polyethylene glycol (MIRALAX / GLYCOLAX) packet 17 g  17 g Oral Daily Marinda ElkFeliz Ortiz, Abraham, MD   17 g at 05/16/17 1020  . sodium chloride flush (NS) 0.9 % injection 3 mL  3 mL Intravenous Q12H Vassie LollMadera, Carlos, MD   3 mL at 05/15/17 0820  . sodium chloride flush (NS) 0.9 % injection 3 mL  3 mL Intravenous Q12H Vassie LollMadera, Carlos, MD   3 mL at 05/16/17 1021  . sodium chloride flush (NS) 0.9 % injection 3 mL  3 mL Intravenous PRN Vassie LollMadera, Carlos, MD      . Vitamin D (Ergocalciferol) (DRISDOL) capsule 50,000 Units  50,000 Units Oral  Q7 days Alwyn RenMathews, Elizabeth G, MD        REVIEW OF SYSTEMS:  [X]  denotes positive finding, [ ]  denotes negative finding Cardiac  Comments:  Chest pain or chest pressure:    Shortness of breath upon exertion: X   Short of breath when lying flat: X   Irregular heart rhythm:        Vascular    Pain in calf, thigh, or hip brought on by ambulation:    Pain in feet at night that wakes you up from your sleep:     Blood clot in your veins:    Leg swelling:  X       Pulmonary    Oxygen at home:    Productive cough:     Wheezing:         Neurologic    Sudden weakness in arms or legs:     Sudden numbness in arms or legs:     Sudden onset of difficulty speaking or slurred speech:    Temporary loss of vision in one eye:     Problems with dizziness:         Gastrointestinal    Blood in stool:     Vomited blood:         Genitourinary    Burning when urinating:     Blood in urine:        Psychiatric    Major depression:         Hematologic    Bleeding problems:    Problems with blood clotting too easily:        Skin    Rashes or ulcers:        Constitutional    Fever or chills:     PHYSICAL EXAM:   Vitals:   05/15/17 1500 05/15/17 2122 05/16/17 0555 05/16/17 1312  BP: 121/70 120/67 (!) 141/85 127/78  Pulse: 67 66 65 62  Resp: 18 20 18 18   Temp: 98.2 F (36.8 C) 98.9 F (37.2 C) 97.8 F (36.6 C) 98.1 F (36.7 C)  TempSrc: Oral Oral Oral Oral  SpO2: 95% 96% 97% 92%  Weight:   228 lb 6.4 oz (103.6 kg)   Height:        GENERAL: The patient is a well-nourished male, in no acute distress. The vital signs are documented above. CARDIAC: There is a regular rate and rhythm.  VASCULAR: I do not detect carotid bruits. He has an IV in his right hand and therefore I cannot assess the right radial pulse.  I cannot  palpate a left radial pulse. I cannot palpate pedal pulses. PULMONARY: There is good air exchange bilaterally without wheezing or rales. ABDOMEN: Soft and  non-tender with normal pitched bowel sounds.  He is obese and it is difficult to assess for aneurysm. MUSCULOSKELETAL: There are no major deformities or cyanosis. NEUROLOGIC: No focal weakness or paresthesias are detected. SKIN: There are no ulcers or rashes noted. PSYCHIATRIC: The patient has a normal affect.  DATA:    VEIN MAP: His vein map is pending.  LABS: His GFR is 11.   MEDICAL ISSUES:   STAGE IV CHRONIC KIDNEY DISEASE: Patient is consulted for hemodialysis access.  He is right-handed so ideally we will try to place access in the left arm.  His vein map is pending.  I have also ordered an arterial Doppler study given that I cannot palpate his radial pulse although he has strong brachial pulses bilaterally.  Through the video translator I have discussed the indications for the surgery and the potential complications.  We have discussed the options of placement of an AV fistula versus an AV graft.  Certainly if at all possible we will place an AV fistula.  We have explained that if this is not successful he would require possibly an AV graft.  Not sure when he will be discharged but I have discussed the case with Dr. Lowell Guitar and we will arrange placement of access as an outpatient.  Nicholas Gutierrez [facility] [phone]

## 2017-05-16 NOTE — Progress Notes (Signed)
PROGRESS NOTE    Nicholas Gutierrez  ZOX:[id number] DOB: May 10, 1951 DOA: 05/11/2017 PCP: Patient, No Pcp Per  Brief Narrative:  67 y.o.malepast medical history of diabetes, hypertension medication noncompliance and obesity who presented on December 26 to [facility] with progressive shortness of breath and lower extremity swelling which began 3 weeks prior to that admission, placed on night pride and [facility] for workup of her acute renal failure 05/15/2017 -reports his daughter 47 yo is on dialysis due to type 1 dm. 05/16/2017-denies any specific complaints today denies any pain urine output 650 cc overnight.  Assessment & Plan:   Active Problems:   Hypertensive urgency   Type 2 diabetes with nephropathy (HCC)   Obesity   Acute diastolic CHF (congestive heart failure) (HCC)   Elevated d-dimer   BPH with urinary obstruction   Adrenal mass (HCC)   Acute renal arterial thrombosis (HCC) Renal failure of unclear etiology ?  Glomerulonephritis.  Renal biopsy done 05/14/2017 results pending.  Ultrasound of the kidney shows normal-appearing kidneys thickening of the bladder wall 6 mm.  His renal functions have bumped yesterday however it looks like it is plateaued at 5.09.  Type 2 diabetes continue NovoLog Mix 70 3012 units twice a day.  Hemoglobin A1c 6.6  BPH with urinary obstruction not on any prostate medications at this time.  Obtain bladder scan.  Adrenal mass renin angiotensin level was ordered.  Status post hypertensive urgency patient is currently on Lasix 40 mg daily, Coreg 12.52 times a day, Norvasc 5 mg daily, hydralazine 100 mg 3 times a day, Imdur 90 mg daily!!!  Vitamin D deficiency with a level of 4.4 will start vitamin D replacement.   DVT prophylaxis: Lovenox Code Status: Full code Family Communication: Discussed with wife Disposition Plan: To be determined possibly after biopsy results are back hoping his renal functions plateaued when okay with  nephrology discharge home.   Consultants:  Renal  Procedures: Right renal biopsy Antimicrobials: None Subjective: No specific complaints wanted to know biopsy results  Objective: Vitals:   05/15/17 1015 05/15/17 1500 05/15/17 2122 05/16/17 0555  BP: 111/65 121/70 120/67 (!) 141/85  Pulse: 66 67 66 65  Resp:  18 20 18   Temp:  98.2 F (36.8 C) 98.9 F (37.2 C) 97.8 F (36.6 C)  TempSrc:  Oral Oral Oral  SpO2:  95% 96% 97%  Weight:    103.6 kg (228 lb 6.4 oz)  Height:        Intake/Output Summary (Last 24 hours) at 05/16/2017 1231 Last data filed at 05/15/2017 1700 Gross per 24 hour  Intake 960 ml  Output 650 ml  Net 310 ml   Filed Weights   05/14/17 0543 05/15/17 0506 05/16/17 0555  Weight: 103.6 kg (228 lb 4.8 oz) 102.3 kg (225 lb 9.6 oz) 103.6 kg (228 lb 6.4 oz)    Examination:  General exam: Appears calm and comfortable  Respiratory system: Clear to auscultation. Respiratory effort normal. Cardiovascular system: S1 & S2 heard, RRR. No JVD, murmurs, rubs, gallops or clicks. No pedal edema. Gastrointestinal system: Abdomen is nondistended, soft and nontender. No organomegaly or masses felt. Normal bowel sounds heard. Central nervous system: Alert and oriented. No focal neurological deficits. Extremities: Symmetric 5 x 5 power. Skin: No rashes, lesions or ulcers.     Data Reviewed: I have personally reviewed following labs and imaging studies  CBC: Recent Labs  Lab 05/11/17 1618 05/14/17 0402 05/16/17 0404  WBC 5.5 5.8 6.1  HGB  12.8* 12.1* 12.0*  HCT 37.4* 35.5* 36.4*  MCV 93.0 93.2 94.3  PLT 198 220 220   Basic Metabolic Panel: Recent Labs  Lab 05/11/17 1618 05/12/17 0431 05/13/17 0440 05/14/17 0402 05/15/17 0404 05/16/17 0404  NA 137 137 137 136 135 134*  134*  K 4.3 3.9 4.1 4.2 4.5 4.4  4.4  CL 110 111 109 107 107 107  107  CO2 20* 20* 21* 22 21* 21*  21*  GLUCOSE 138* 98 87 112* 82 105*  104*  BUN 46* 50* 52* 53* 57* 62*  61*    CREATININE 4.68* 4.77* 4.62* 4.73* 4.97* 5.09*  5.10*  CALCIUM 7.9* 7.9* 8.0* 7.9* 7.9* 8.1*  8.1*  MG 2.4  --   --   --   --  2.5*  PHOS 5.8*  --  5.5* 5.7* 6.3* 5.7*  5.9*   GFR: Estimated Creatinine Clearance: 15.4 mL/min (A) (by C-G formula based on SCr of 5.1 mg/dL (H)). Liver Function Tests: Recent Labs  Lab 05/11/17 1618 05/13/17 0440 05/14/17 0402 05/15/17 0404 05/16/17 0404  AST 26  --   --   --  18  ALT 17  --   --   --  14*  ALKPHOS 100  --   --   --  95  BILITOT 0.6  --   --   --  0.8  PROT 5.7*  --   --   --  5.6*  ALBUMIN 1.9* 1.9* 1.9* 1.8* 2.1*  2.0*   No results for input(s): LIPASE, AMYLASE in the last 168 hours. No results for input(s): AMMONIA in the last 168 hours. Coagulation Profile: Recent Labs  Lab 05/13/17 0440  INR 0.97   Cardiac Enzymes: No results for input(s): CKTOTAL, CKMB, CKMBINDEX, TROPONINI in the last 168 hours. BNP (last 3 results) No results for input(s): PROBNP in the last 8760 hours. HbA1C: No results for input(s): HGBA1C in the last 72 hours. CBG: Recent Labs  Lab 05/15/17 1153 05/15/17 1651 05/15/17 2121 05/16/17 0728 05/16/17 1140  GLUCAP 112* 147* 164* 106* 127*   Lipid Profile: No results for input(s): CHOL, HDL, LDLCALC, TRIG, CHOLHDL, LDLDIRECT in the last 72 hours. Thyroid Function Tests: No results for input(s): TSH, T4TOTAL, FREET4, T3FREE, THYROIDAB in the last 72 hours. Anemia Panel: No results for input(s): VITAMINB12, FOLATE, FERRITIN, TIBC, IRON, RETICCTPCT in the last 72 hours. Sepsis Labs: No results for input(s): PROCALCITON, LATICACIDVEN in the last 168 hours.  No results found for this or any previous visit (from the past 240 hour(s)).       Radiology Studies: US Renal  Result Date: 05/15/2017 CLINICAL DATA:  Renal biopsy May 14, 2017. Chronic renal disease. EXAM: RENAL / URINARY TRACT ULTRASOUND COMPLETE COMPARISON:  None. FINDINGS: Right Kidney: Length: 11.4 cm. Echogenicity within  normal limits. No mass or hydronephrosis visualized. Left Kidney: Length: 11.9 cm. Echogenicity within normal limits. No mass or hydronephrosis visualized. Bladder: Bladder wall thickening measuring 6 mm.  Poor distention. IMPRESSION: 1. The kidneys are normal in appearance. 2. The bladder wall appears somewhat thickened measuring 6 mm. Recommend clinical correlation. Electronically Signed   By: Gerome Sam III M.D   On: 05/15/2017 14:36   US Biopsy (kidney)  Result Date: 05/14/2017 INDICATION: Acute renal failure, diabetes, hypertension EXAM: ULTRASOUND RIGHT RENAL BIOPSY MEDICATIONS: 1% LIDOCAINE LOCAL ANESTHESIA/SEDATION: Moderate (conscious) sedation was employed during this procedure. A total of Versed 2.0 mg and Fentanyl 50 mcg was administered intravenously. Moderate Sedation Time: 10 minutes.  The patient's level of consciousness and vital signs were monitored continuously by radiology nursing throughout the procedure under my direct supervision. FLUOROSCOPY TIME:  Fluoroscopy Time: NONE. COMPLICATIONS: None immediate. PROCEDURE: Informed written consent was obtained from the patient after a thorough discussion of the procedural risks, benefits and alternatives. All questions were addressed. Maximal Sterile Barrier Technique was utilized including caps, mask, sterile gowns, sterile gloves, sterile drape, hand hygiene and skin antiseptic. A timeout was performed prior to the initiation of the procedure. Patient positioned prone. Preliminary ultrasound performed. Right kidney is slightly more visible for ultrasound percutaneous access. Overlying skin marked. Under sterile conditions and local anesthesia, a 15 gauge coaxial guide needle was advanced to the right kidney lower pole. Needle position confirmed with ultrasound. 2 16 gauge core biopsies obtained through the access. Samples were intact and non fragmented. These were placed in saline. Needle removed. Postprocedure imaging demonstrates no  hemorrhage or hematoma. Patient on the biopsy well. IMPRESSION: Successful ultrasound right kidney core biopsy. Electronically Signed   By: Judie PetitM.  Shick M.D.   On: 05/14/2017 13:38        Scheduled Meds: . amLODipine  5 mg Oral Daily  . carvedilol  12.5 mg Oral BID WC  . furosemide  40 mg Oral Daily  . heparin injection (subcutaneous)  5,000 Units Subcutaneous Q8H  . hydrALAZINE  100 mg Oral Q8H  . insulin aspart protamine- aspart  12 Units Subcutaneous BID WC  . isosorbide mononitrate  90 mg Oral Daily  . polyethylene glycol  17 g Oral Daily  . sodium chloride flush  3 mL Intravenous Q12H  . sodium chloride flush  3 mL Intravenous Q12H   Continuous Infusions: . sodium chloride       LOS: 5 days      Alwyn RenElizabeth G Kaydenn Mclear, MD [facility]  If 7PM-7AM, please contact night-coverage [email or web address] Password [email or web address] 05/16/2017, 12:31 PM

## 2017-05-16 NOTE — Progress Notes (Signed)
Assessment/Plan: 1. 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  Will order vein mapping and ask [facility] to see in hospital due to language issues.  I spoke at length with pt and spouse via Doctor, general practicetrattus translator.                                                                                                                                       Will arrange out-pt appt with Dr Neita CarpWebb(speaks spanish) for Jan [facility], [address], [phone] 2. Acute diastolic CHF 3. Nephrotic syndrome withHypoalbuminemia 4. Hypertensive urgency- improved  5. Adrenal mass(reported) 6. Urinary retention, recent   Subjective: Interval History: Concerned about prolonged hospitalization  Objective: Vital signs in last 24 hours: Temp:  [97.8 F (36.6 C)-98.9 F (37.2 C)] 98.1 F (36.7 C) (01/04 1312) Pulse Rate:  [62-66] 62 (01/04 1312) Resp:  [18-20] 18 (01/04 1312) BP: (120-141)/(67-85) 127/78 (01/04 1312) SpO2:  [92 %-97 %] 92 % (01/04 1312) Weight:  [103.6 kg (228 lb 6.4 oz)] 103.6 kg (228 lb 6.4 oz) (01/04 0555) Weight change: 1.27 kg (2 lb 12.8 oz)  Intake/Output from previous day: 01/03 0701 - 01/04 0700 In: 1200 [P.O.:1200] Out: 650 [Urine:650] Intake/Output this shift: No intake/output data recorded.  General appearance: alert and cooperative Resp: clear to auscultation bilaterally Cardio: regular rate and rhythm, S1, S2 normal, no murmur, click, rub or gallop Extremities: edema 1+  Lab Results: Recent Labs    05/14/17 0402 05/16/17 0404  WBC 5.8 6.1  HGB 12.1* 12.0*  HCT 35.5* 36.4*  PLT 220 220   BMET:  Recent Labs    05/15/17 0404 05/16/17 0404  NA 135 134*  134*  K 4.5 4.4  4.4  CL 107 107  107  CO2 21* 21*  21*  GLUCOSE 82 105*  104*  BUN 57* 62*  61*  CREATININE 4.97* 5.09*  5.10*  CALCIUM 7.9* 8.1*  8.1*   No results for input(s): PTH in the last 72 hours. Iron Studies: No  results for input(s): IRON, TIBC, TRANSFERRIN, FERRITIN in the last 72 hours. Studies/Results: [place] Renal  Result Date: 05/15/2017 CLINICAL DATA:  Renal biopsy May 14, 2017. Chronic renal disease. EXAM: RENAL / URINARY TRACT ULTRASOUND COMPLETE COMPARISON:  None. FINDINGS: Right Kidney: Length: 11.4 cm. Echogenicity within normal limits. No mass or hydronephrosis visualized. Left Kidney: Length: 11.9 cm. Echogenicity within normal limits. No mass or hydronephrosis visualized. Bladder: Bladder wall thickening measuring 6 mm.  Poor distention. IMPRESSION: 1. The kidneys are normal in appearance. 2. The bladder wall appears somewhat thickened measuring 6 mm. Recommend clinical correlation. Electronically Signed   By: Gerome Samavid  Williams III M.D   On: 05/15/2017 14:36    Scheduled: . amLODipine  5 mg Oral Daily  . carvedilol  12.5 mg Oral BID WC  . furosemide  40 mg Oral Daily  .  heparin injection (subcutaneous)  5,000 Units Subcutaneous Q8H  . hydrALAZINE  100 mg Oral Q8H  . insulin aspart protamine- aspart  12 Units Subcutaneous BID WC  . isosorbide mononitrate  90 mg Oral Daily  . polyethylene glycol  17 g Oral Daily  . sodium chloride flush  3 mL Intravenous Q12H  . sodium chloride flush  3 mL Intravenous Q12H  . Vitamin D (Ergocalciferol)  50,000 Units Oral Q7 days       LOS: 5 days   Lauris Poag 05/16/2017,3:17 PM

## 2017-05-17 ENCOUNTER — Inpatient Hospital Stay (HOSPITAL_COMMUNITY): Payer: Commercial Managed Care - PPO

## 2017-05-17 DIAGNOSIS — Z01810 Encounter for preprocedural cardiovascular examination: Secondary | ICD-10-CM

## 2017-05-17 LAB — RENAL FUNCTION PANEL
Albumin: 2.2 g/dL — ABNORMAL LOW (ref 3.5–5.0)
Anion gap: 6 (ref 5–15)
BUN: 67 mg/dL — ABNORMAL HIGH (ref 6–20)
CALCIUM: 8.1 mg/dL — AB (ref 8.9–10.3)
CO2: 21 mmol/L — ABNORMAL LOW (ref 22–32)
CREATININE: 4.99 mg/dL — AB (ref 0.61–1.24)
Chloride: 108 mmol/L (ref 101–111)
GFR, EST AFRICAN AMERICAN: 13 mL/min — AB (ref >60)
GFR, EST NON AFRICAN AMERICAN: 11 mL/min — AB (ref >60)
Glucose, Bld: 117 mg/dL — ABNORMAL HIGH (ref 65–99)
Phosphorus: 5.7 mg/dL — ABNORMAL HIGH (ref 2.5–4.6)
Potassium: 4.9 mmol/L (ref 3.5–5.1)
SODIUM: 135 mmol/L (ref 135–145)

## 2017-05-17 LAB — GLUCOSE, CAPILLARY
GLUCOSE-CAPILLARY: 128 mg/dL — AB (ref 65–99)
Glucose-Capillary: 120 mg/dL — ABNORMAL HIGH (ref 65–99)

## 2017-05-17 MED ORDER — INSULIN ASPART PROT & ASPART (70-30 MIX) 100 UNIT/ML ~~LOC~~ SUSP: 12.0000 [IU] | Freq: Two times a day (BID) | SUBCUTANEOUS | 11 refills | Status: AC

## 2017-05-17 MED ORDER — FUROSEMIDE 40 MG PO TABS: 40.0000 mg | ORAL_TABLET | Freq: Every day | ORAL | 0 refills | Status: AC

## 2017-05-17 MED ORDER — CARVEDILOL 12.5 MG PO TABS: 12.5000 mg | ORAL_TABLET | Freq: Two times a day (BID) | ORAL | 0 refills | Status: AC

## 2017-05-17 MED ORDER — POLYETHYLENE GLYCOL 3350 17 G PO PACK: 17.0000 g | PACK | Freq: Every day | ORAL | 0 refills | Status: AC

## 2017-05-17 MED ORDER — VITAMIN D (ERGOCALCIFEROL) 1.25 MG (50000 UNIT) PO CAPS: 50000.0000 [IU] | ORAL_CAPSULE | ORAL | 0 refills | Status: AC

## 2017-05-17 MED ORDER — ISOSORBIDE MONONITRATE ER 30 MG PO TB24: 90.0000 mg | ORAL_TABLET | Freq: Every day | ORAL | 0 refills | Status: AC

## 2017-05-17 MED ORDER — HYDRALAZINE HCL 100 MG PO TABS: 100.0000 mg | ORAL_TABLET | Freq: Three times a day (TID) | ORAL | 0 refills | Status: AC

## 2017-05-17 NOTE — Progress Notes (Signed)
Assessment/Plan: 1. 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    I spoke at length with pt and spouse via Doctor, general practicetrattus translator.      Will arrange out-pt appt with Dr Neita CarpWebb(speaks spanish) for Jan 22 at 2:30pm [facility], [address], [id number] 2. Acute diastolic CHF 3. Nephrotic syndrome withHypoalbuminemia 4. Hypertensive urgency- improved  5. Adrenal mass(reported) 6. Urinary retention, recent   Subjective: Hopefully can get vein mapping before discharge.  Appreciate Dr Edilia Boickson eval  Objective: Vital signs in last 24 hours: Temp:  [97.9 F (36.6 C)-99 F (37.2 C)] 98.2 F (36.8 C) (01/05 0617) Pulse Rate:  [62-68] 68 (01/05 0617) Resp:  [15-18] 16 (01/05 0617) BP: (121-130)/(65-78) 127/65 (01/05 0617) SpO2:  [92 %-94 %] 94 % (01/05 0617) Weight:  [104.1 kg (229 lb 8 oz)] 104.1 kg (229 lb 8 oz) (01/05 0500) Weight change: 0.498 kg (1 lb 1.6 oz)  Intake/Output from previous day: 01/04 0701 - 01/05 0700 In: 240 [P.O.:240] Out: 300 [Urine:300] Intake/Output this shift: No intake/output data recorded.  General appearance: alert and cooperative Resp: clear to auscultation bilaterally Cardio: regular rate and rhythm, S1, S2 normal, no murmur, click, rub or gallop Extremities: edema 1+  Lab Results: Recent Labs    05/16/17 0404  WBC 6.1  HGB 12.0*  HCT 36.4*  PLT 220   BMET:  Recent Labs    05/16/17 0404 05/17/17 0535  NA 134*  134* 135  K 4.4  4.4 4.9  CL 107  107 108  CO2 21*  21* 21*  GLUCOSE 105*  104* 117*  BUN 62*  61* 67*  CREATININE 5.09*  5.10* 4.99*  CALCIUM 8.1*  8.1* 8.1*   No results for input(s): PTH in the last 72 hours. Iron Studies: No results for input(s): IRON, TIBC, TRANSFERRIN, FERRITIN in the last 72 hours. Studies/Results: [place] Renal  Result Date: 05/15/2017 CLINICAL DATA:  Renal biopsy May 14, 2017. Chronic renal disease. EXAM: RENAL / URINARY TRACT  ULTRASOUND COMPLETE COMPARISON:  None. FINDINGS: Right Kidney: Length: 11.4 cm. Echogenicity within normal limits. No mass or hydronephrosis visualized. Left Kidney: Length: 11.9 cm. Echogenicity within normal limits. No mass or hydronephrosis visualized. Bladder: Bladder wall thickening measuring 6 mm.  Poor distention. IMPRESSION: 1. The kidneys are normal in appearance. 2. The bladder wall appears somewhat thickened measuring 6 mm. Recommend clinical correlation. Electronically Signed   By: Gerome Samavid  Williams III M.D   On: 05/15/2017 14:36    Scheduled: . amLODipine  5 mg Oral Daily  . carvedilol  12.5 mg Oral BID WC  . furosemide  40 mg Oral Daily  . heparin injection (subcutaneous)  5,000 Units Subcutaneous Q8H  . hydrALAZINE  100 mg Oral Q8H  . insulin aspart protamine- aspart  12 Units Subcutaneous BID WC  . isosorbide mononitrate  90 mg Oral Daily  . polyethylene glycol  17 g Oral Daily  . sodium chloride flush  3 mL Intravenous Q12H  . sodium chloride flush  3 mL Intravenous Q12H  . Vitamin D (Ergocalciferol)  50,000 Units Oral Q7 days   Continuous: . sodium chloride        LOS: 6 days   Lauris PoagAlvin C Ashyr Hedgepath 05/17/2017,8:18 AM

## 2017-05-17 NOTE — Progress Notes (Signed)
The RN used the video interpreter to explain to the patient that he was to be transferred to Room 1615. The patient and the patient's wife were understanding and were only concerned that this would delay discharge from the hospital. The RN explained again through the interpreter that the doctor would follow the patient to whichever floor he was transferred to.

## 2017-05-17 NOTE — Discharge Summary (Addendum)
Physician Discharge Summary  Nicholas Gutierrez WUJ:[id number]:[id number] DOB: 1950-12-03 DOA: 05/11/2017  PCP: Patient, No Pcp Per  Admit date: 05/11/2017 Discharge date: 05/17/2017  Admitted From:home Disposition:  home Recommendations for Outpatient Follow-up:  1. Follow up with PCP in 1-2 weeks follow up with dr webb nephrology on jan 22 at 230. 2. Please obtain BMP/CBC in one week  Home Health none Equipment/Devices none  Discharge Condition stable CODE STATUS: full Diet recommendation:renal  Brief/Interim Summary:66 y.o.malepast medical history of diabetes, hypertension medication noncompliance and obesity who presented on December 26 to [facility] with progressive shortness of breath and lower extremity swelling which began 3 weeks prior to that admission, placed on night pride and [facility] for workup of her acute renal failure 05/15/2017 -reports his daughter 65 yo is on dialysis due to type 1 dm. 05/16/2017-denies any specific complaints today denies any pain urine output 650 cc overnight.    Discharge Diagnoses:  Active Problems:   Hypertensive urgency   Type 2 diabetes with nephropathy (HCC)   Obesity   Acute diastolic CHF (congestive heart failure) (HCC)   Elevated d-dimer   BPH with urinary obstruction   Adrenal mass (HCC)   Acute renal arterial thrombosis (HCC)  Chronic glomerulonephritis biopsy-proven-patient had vein mapping done today and was seen by Dr. Durwin Noraixon for vascular surgery for possible graft placement and to follow-up with him after discharge.  Patient will also follow-up with Dr. Hyman HopesWebb on January 22 at 230.  Type 2 diabetes diet-controlled  BPH patient is having urine output  Status post hypertensive urgency continue current medications DC amlodipine blood pressure has been in the low to low normal range in the 120s.  Stable continue Lasix  Diastolic CHF acute stable  Nephrotic syndrome with hypoalbuminemia follow-up with  renal  Incidental finding of adrenal mass follow up as out patient    Discharge Instructions   Allergies as of 05/17/2017   No Known Allergies     Medication List    TAKE these medications   carvedilol 12.5 MG tablet Commonly known as:  COREG Take 1 tablet (12.5 mg total) by mouth 2 (two) times daily with a meal.   furosemide 40 MG tablet Commonly known as:  LASIX Take 1 tablet (40 mg total) by mouth daily. Start taking on:  05/18/2017   hydrALAZINE 100 MG tablet Commonly known as:  APRESOLINE Take 1 tablet (100 mg total) by mouth every 8 (eight) hours.   insulin aspart protamine- aspart (70-30) 100 UNIT/ML injection Commonly known as:  NOVOLOG MIX 70/30 Inject 0.12 mLs (12 Units total) into the skin 2 (two) times daily with a meal.   isosorbide mononitrate 30 MG 24 hr tablet Commonly known as:  IMDUR Take 3 tablets (90 mg total) by mouth daily. Start taking on:  05/18/2017   polyethylene glycol packet Commonly known as:  MIRALAX / GLYCOLAX Take 17 g by mouth daily. Start taking on:  05/18/2017   Vitamin D (Ergocalciferol) 50000 units Caps capsule Commonly known as:  DRISDOL Take 1 capsule (50,000 Units total) by mouth every 7 (seven) days. Start taking on:  05/23/2017       No Known Allergies  Consultations:  renal   Procedures/Studies: [place] Renal  Result Date: 05/15/2017 CLINICAL DATA:  Renal biopsy May 14, 2017. Chronic renal disease. EXAM: RENAL / URINARY TRACT ULTRASOUND COMPLETE COMPARISON:  None. FINDINGS: Right Kidney: Length: 11.4 cm. Echogenicity within normal limits. No mass or hydronephrosis visualized. Left Kidney: Length: 11.9 cm. Echogenicity  within normal limits. No mass or hydronephrosis visualized. Bladder: Bladder wall thickening measuring 6 mm.  Poor distention. IMPRESSION: 1. The kidneys are normal in appearance. 2. The bladder wall appears somewhat thickened measuring 6 mm. Recommend clinical correlation. Electronically Signed   By: Gerome Sam III M.D   On: 05/15/2017 14:36   US Biopsy (kidney)  Result Date: 05/14/2017 INDICATION: Acute renal failure, diabetes, hypertension EXAM: ULTRASOUND RIGHT RENAL BIOPSY MEDICATIONS: 1% LIDOCAINE LOCAL ANESTHESIA/SEDATION: Moderate (conscious) sedation was employed during this procedure. A total of Versed 2.0 mg and Fentanyl 50 mcg was administered intravenously. Moderate Sedation Time: 10 minutes. The patient's level of consciousness and vital signs were monitored continuously by radiology nursing throughout the procedure under my direct supervision. FLUOROSCOPY TIME:  Fluoroscopy Time: NONE. COMPLICATIONS: None immediate. PROCEDURE: Informed written consent was obtained from the patient after a thorough discussion of the procedural risks, benefits and alternatives. All questions were addressed. Maximal Sterile Barrier Technique was utilized including caps, mask, sterile gowns, sterile gloves, sterile drape, hand hygiene and skin antiseptic. A timeout was performed prior to the initiation of the procedure. Patient positioned prone. Preliminary ultrasound performed. Right kidney is slightly more visible for ultrasound percutaneous access. Overlying skin marked. Under sterile conditions and local anesthesia, a 15 gauge coaxial guide needle was advanced to the right kidney lower pole. Needle position confirmed with ultrasound. 2 16 gauge core biopsies obtained through the access. Samples were intact and non fragmented. These were placed in saline. Needle removed. Postprocedure imaging demonstrates no hemorrhage or hematoma. Patient on the biopsy well. IMPRESSION: Successful ultrasound right kidney core biopsy. Electronically Signed   By: Judie Petit.  Shick M.D.   On: 05/14/2017 13:38    (Echo, Carotid, EGD, Colonoscopy, ERCP)    Subjective:   Discharge Exam: Vitals:   05/17/17 0331 05/17/17 0617  BP: 130/72 127/65  Pulse: 67 68  Resp: 15 16  Temp: 97.9 F (36.6 C) 98.2 F (36.8 C)  SpO2: 93% 94%    Vitals:   05/16/17 2037 05/17/17 0331 05/17/17 0500 05/17/17 0617  BP: 121/77 130/72  127/65  Pulse: 67 67  68  Resp: 18 15  16   Temp: 99 F (37.2 C) 97.9 F (36.6 C)  98.2 F (36.8 C)  TempSrc: Oral Oral  Oral  SpO2: 94% 93%  94%  Weight:   104.1 kg (229 lb 8 oz)   Height:        General: Pt is alert, awake, not in acute distress Cardiovascular: RRR, S1/S2 +, no rubs, no gallops Respiratory: CTA bilaterally, no wheezing, no rhonchi Abdominal: Soft, NT, ND, bowel sounds + Extremities: no edema, no cyanosis    The results of significant diagnostics from this hospitalization (including imaging, microbiology, ancillary and laboratory) are listed below for reference.     Microbiology: No results found for this or any previous visit (from the past 240 hour(s)).   Labs: BNP (last 3 results) No results for input(s): BNP in the last 8760 hours. Basic Metabolic Panel: Recent Labs  Lab 05/11/17 1618  05/13/17 0440 05/14/17 0402 05/15/17 0404 05/16/17 0404 05/17/17 0535  NA 137   < > 137 136 135 134*  134* 135  K 4.3   < > 4.1 4.2 4.5 4.4  4.4 4.9  CL 110   < > 109 107 107 107  107 108  CO2 20*   < > 21* 22 21* 21*  21* 21*  GLUCOSE 138*   < > 87 112* 82 105*  104* 117*  BUN 46*   < > 52* 53* 57* 62*  61* 67*  CREATININE 4.68*   < > 4.62* 4.73* 4.97* 5.09*  5.10* 4.99*  CALCIUM 7.9*   < > 8.0* 7.9* 7.9* 8.1*  8.1* 8.1*  MG 2.4  --   --   --   --  2.5*  --   PHOS 5.8*  --  5.5* 5.7* 6.3* 5.7*  5.9* 5.7*   < > = values in this interval not displayed.   Liver Function Tests: Recent Labs  Lab 05/11/17 1618 05/13/17 0440 05/14/17 0402 05/15/17 0404 05/16/17 0404 05/17/17 0535  AST 26  --   --   --  18  --   ALT 17  --   --   --  14*  --   ALKPHOS 100  --   --   --  95  --   BILITOT 0.6  --   --   --  0.8  --   PROT 5.7*  --   --   --  5.6*  --   ALBUMIN 1.9* 1.9* 1.9* 1.8* 2.1*  2.0* 2.2*   No results for input(s): LIPASE, AMYLASE in the last 168  hours. No results for input(s): AMMONIA in the last 168 hours. CBC: Recent Labs  Lab 05/11/17 1618 05/14/17 0402 05/16/17 0404  WBC 5.5 5.8 6.1  HGB 12.8* 12.1* 12.0*  HCT 37.4* 35.5* 36.4*  MCV 93.0 93.2 94.3  PLT 198 220 220   Cardiac Enzymes: No results for input(s): CKTOTAL, CKMB, CKMBINDEX, TROPONINI in the last 168 hours. BNP: Invalid input(s): POCBNP CBG: Recent Labs  Lab 05/16/17 0728 05/16/17 1140 05/16/17 1633 05/16/17 2034 05/17/17 0718  GLUCAP 106* 127* 153* 133* 128*   D-Dimer No results for input(s): DDIMER in the last 72 hours. Hgb A1c No results for input(s): HGBA1C in the last 72 hours. Lipid Profile No results for input(s): CHOL, HDL, LDLCALC, TRIG, CHOLHDL, LDLDIRECT in the last 72 hours. Thyroid function studies No results for input(s): TSH, T4TOTAL, T3FREE, THYROIDAB in the last 72 hours.  Invalid input(s): FREET3 Anemia work up No results for input(s): VITAMINB12, FOLATE, FERRITIN, TIBC, IRON, RETICCTPCT in the last 72 hours. Urinalysis    Component Value Date/Time   COLORURINE YELLOW 05/12/2017 1657   APPEARANCEUR CLEAR 05/12/2017 1657   LABSPEC 1.013 05/12/2017 1657   PHURINE 6.0 05/12/2017 1657   GLUCOSEU >=500 (A) 05/12/2017 1657   HGBUR NEGATIVE 05/12/2017 1657   BILIRUBINUR NEGATIVE 05/12/2017 1657   KETONESUR 5 (A) 05/12/2017 1657   PROTEINUR >=300 (A) 05/12/2017 1657   NITRITE NEGATIVE 05/12/2017 1657   LEUKOCYTESUR NEGATIVE 05/12/2017 1657   Sepsis Labs Invalid input(s): PROCALCITONIN,  WBC,  LACTICIDVEN Microbiology No results found for this or any previous visit (from the past 240 hour(s)).   Time coordinating discharge: Over 30 minutes  SIGNED:   Alwyn Ren, MD  [facility] 05/17/2017, 10:55 AM Pager   If 7PM-7AM, please contact night-coverage [email or web address] Password [email or web address]

## 2017-05-17 NOTE — Progress Notes (Signed)
Discharged from floor via w/c for transport home by car. Belongings & family with pt. No changes in assessment. Nicholas Gutierrez  

## 2017-05-17 NOTE — Progress Notes (Signed)
Bilateral upper extremity vein mapping has been completed.   Bilateral upper extremity arterial duplex has been completed. Triphasic waveforms were detected in the subclavian, axillary, brachial, radial, and ulnar arteries bilaterally.  05/17/17 10:32 AM Olen CordialGreg Duell Holdren RVT

## 2017-05-17 NOTE — Progress Notes (Signed)
[facility] to [facility] on [address].

## 2017-05-20 ENCOUNTER — Encounter (HOSPITAL_COMMUNITY): Payer: Self-pay

## 2017-05-20 LAB — ALDOSTERONE + RENIN ACTIVITY W/ RATIO
ALDO / PRA RATIO: 11.6 (ref 0.0–30.0)
ALDOSTERONE: 3.5 ng/dL (ref 0.0–30.0)
PRA LC/MS/MS: 0.301 ng/mL/h (ref 0.167–5.380)

## 2017-05-24 ENCOUNTER — Inpatient Hospital Stay (HOSPITAL_COMMUNITY)
Admission: EM | Admit: 2017-05-24 | Discharge: 2017-05-30 | DRG: 291 | Disposition: A | Discharge status: Home Health | Payer: Commercial Managed Care - PPO | Attending: Internal Medicine | Admitting: Internal Medicine

## 2017-05-24 ENCOUNTER — Other Ambulatory Visit: Payer: Self-pay

## 2017-05-24 ENCOUNTER — Emergency Department (HOSPITAL_COMMUNITY): Payer: Commercial Managed Care - PPO

## 2017-05-24 ENCOUNTER — Encounter (HOSPITAL_COMMUNITY): Payer: Self-pay | Admitting: Emergency Medicine

## 2017-05-24 DIAGNOSIS — N186 End stage renal disease: Secondary | ICD-10-CM | POA: Diagnosis present

## 2017-05-24 DIAGNOSIS — N401 Enlarged prostate with lower urinary tract symptoms: Secondary | ICD-10-CM | POA: Diagnosis present

## 2017-05-24 DIAGNOSIS — E669 Obesity, unspecified: Secondary | ICD-10-CM | POA: Diagnosis present

## 2017-05-24 DIAGNOSIS — Z6838 Body mass index (BMI) 38.0-38.9, adult: Secondary | ICD-10-CM

## 2017-05-24 DIAGNOSIS — E1121 Type 2 diabetes mellitus with diabetic nephropathy: Secondary | ICD-10-CM | POA: Diagnosis not present

## 2017-05-24 DIAGNOSIS — E8770 Fluid overload, unspecified: Secondary | ICD-10-CM

## 2017-05-24 DIAGNOSIS — J9601 Acute respiratory failure with hypoxia: Secondary | ICD-10-CM

## 2017-05-24 DIAGNOSIS — I4892 Unspecified atrial flutter: Secondary | ICD-10-CM | POA: Diagnosis not present

## 2017-05-24 DIAGNOSIS — N185 Chronic kidney disease, stage 5: Secondary | ICD-10-CM | POA: Diagnosis not present

## 2017-05-24 DIAGNOSIS — Y95 Nosocomial condition: Secondary | ICD-10-CM | POA: Diagnosis present

## 2017-05-24 DIAGNOSIS — N138 Other obstructive and reflux uropathy: Secondary | ICD-10-CM | POA: Diagnosis present

## 2017-05-24 DIAGNOSIS — I5033 Acute on chronic diastolic (congestive) heart failure: Secondary | ICD-10-CM | POA: Diagnosis present

## 2017-05-24 DIAGNOSIS — E278 Other specified disorders of adrenal gland: Secondary | ICD-10-CM | POA: Diagnosis present

## 2017-05-24 DIAGNOSIS — E279 Disorder of adrenal gland, unspecified: Secondary | ICD-10-CM | POA: Diagnosis present

## 2017-05-24 DIAGNOSIS — Z23 Encounter for immunization: Secondary | ICD-10-CM | POA: Diagnosis not present

## 2017-05-24 DIAGNOSIS — Z95828 Presence of other vascular implants and grafts: Secondary | ICD-10-CM | POA: Diagnosis not present

## 2017-05-24 DIAGNOSIS — N039 Chronic nephritic syndrome with unspecified morphologic changes: Secondary | ICD-10-CM | POA: Diagnosis present

## 2017-05-24 DIAGNOSIS — E8779 Other fluid overload: Secondary | ICD-10-CM | POA: Diagnosis not present

## 2017-05-24 DIAGNOSIS — I132 Hypertensive heart and chronic kidney disease with heart failure and with stage 5 chronic kidney disease, or end stage renal disease: Secondary | ICD-10-CM | POA: Diagnosis present

## 2017-05-24 DIAGNOSIS — I4510 Unspecified right bundle-branch block: Secondary | ICD-10-CM | POA: Diagnosis present

## 2017-05-24 DIAGNOSIS — E8809 Other disorders of plasma-protein metabolism, not elsewhere classified: Secondary | ICD-10-CM | POA: Diagnosis present

## 2017-05-24 DIAGNOSIS — N179 Acute kidney failure, unspecified: Secondary | ICD-10-CM

## 2017-05-24 DIAGNOSIS — I5023 Acute on chronic systolic (congestive) heart failure: Secondary | ICD-10-CM | POA: Diagnosis not present

## 2017-05-24 DIAGNOSIS — Z79899 Other long term (current) drug therapy: Secondary | ICD-10-CM | POA: Diagnosis not present

## 2017-05-24 DIAGNOSIS — N189 Chronic kidney disease, unspecified: Secondary | ICD-10-CM | POA: Diagnosis not present

## 2017-05-24 DIAGNOSIS — Z09 Encounter for follow-up examination after completed treatment for conditions other than malignant neoplasm: Secondary | ICD-10-CM

## 2017-05-24 DIAGNOSIS — J189 Pneumonia, unspecified organism: Secondary | ICD-10-CM

## 2017-05-24 DIAGNOSIS — D631 Anemia in chronic kidney disease: Secondary | ICD-10-CM | POA: Diagnosis present

## 2017-05-24 DIAGNOSIS — I509 Heart failure, unspecified: Secondary | ICD-10-CM | POA: Diagnosis not present

## 2017-05-24 DIAGNOSIS — I5043 Acute on chronic combined systolic (congestive) and diastolic (congestive) heart failure: Secondary | ICD-10-CM | POA: Diagnosis not present

## 2017-05-24 DIAGNOSIS — Z794 Long term (current) use of insulin: Secondary | ICD-10-CM | POA: Diagnosis not present

## 2017-05-24 DIAGNOSIS — E1122 Type 2 diabetes mellitus with diabetic chronic kidney disease: Secondary | ICD-10-CM | POA: Diagnosis present

## 2017-05-24 DIAGNOSIS — R0602 Shortness of breath: Secondary | ICD-10-CM | POA: Diagnosis present

## 2017-05-24 DIAGNOSIS — J810 Acute pulmonary edema: Secondary | ICD-10-CM

## 2017-05-24 DIAGNOSIS — N049 Nephrotic syndrome with unspecified morphologic changes: Secondary | ICD-10-CM | POA: Diagnosis present

## 2017-05-24 DIAGNOSIS — I5031 Acute diastolic (congestive) heart failure: Secondary | ICD-10-CM | POA: Diagnosis not present

## 2017-05-24 DIAGNOSIS — I4891 Unspecified atrial fibrillation: Secondary | ICD-10-CM | POA: Diagnosis not present

## 2017-05-24 DIAGNOSIS — E875 Hyperkalemia: Secondary | ICD-10-CM | POA: Diagnosis present

## 2017-05-24 DIAGNOSIS — I340 Nonrheumatic mitral (valve) insufficiency: Secondary | ICD-10-CM | POA: Diagnosis not present

## 2017-05-24 DIAGNOSIS — E119 Type 2 diabetes mellitus without complications: Secondary | ICD-10-CM | POA: Diagnosis not present

## 2017-05-24 DIAGNOSIS — Z992 Dependence on renal dialysis: Secondary | ICD-10-CM

## 2017-05-24 DIAGNOSIS — J9600 Acute respiratory failure, unspecified whether with hypoxia or hypercapnia: Secondary | ICD-10-CM

## 2017-05-24 HISTORY — DX: Chronic kidney disease, unspecified: N18.9

## 2017-05-24 HISTORY — DX: Heart failure, unspecified: I50.9

## 2017-05-24 HISTORY — DX: Dyspnea, unspecified: R06.00

## 2017-05-24 LAB — I-STAT VENOUS BLOOD GAS, ED
ACID-BASE DEFICIT: 6 mmol/L — AB (ref 0.0–2.0)
Bicarbonate: 19.9 mmol/L — ABNORMAL LOW (ref 20.0–28.0)
O2 SAT: 88 %
TCO2: 21 mmol/L — ABNORMAL LOW (ref 22–32)
pCO2, Ven: 38.4 mmHg — ABNORMAL LOW (ref 44.0–60.0)
pH, Ven: 7.322 (ref 7.250–7.430)
pO2, Ven: 58 mmHg — ABNORMAL HIGH (ref 32.0–45.0)

## 2017-05-24 LAB — GLUCOSE, CAPILLARY: Glucose-Capillary: 99 mg/dL (ref 65–99)

## 2017-05-24 LAB — BASIC METABOLIC PANEL
Anion gap: 11 (ref 5–15)
BUN: 90 mg/dL — AB (ref 6–20)
CALCIUM: 8.1 mg/dL — AB (ref 8.9–10.3)
CO2: 18 mmol/L — ABNORMAL LOW (ref 22–32)
CREATININE: 5.58 mg/dL — AB (ref 0.61–1.24)
Chloride: 107 mmol/L (ref 101–111)
GFR calc Af Amer: 11 mL/min — ABNORMAL LOW (ref >60)
GFR, EST NON AFRICAN AMERICAN: 10 mL/min — AB (ref >60)
GLUCOSE: 157 mg/dL — AB (ref 65–99)
POTASSIUM: 5.3 mmol/L — AB (ref 3.5–5.1)
Sodium: 136 mmol/L (ref 135–145)

## 2017-05-24 LAB — I-STAT TROPONIN, ED
TROPONIN I, POC: 0.01 ng/mL (ref 0.00–0.08)
Troponin i, poc: 0.01 ng/mL (ref 0.00–0.08)

## 2017-05-24 LAB — HEPATIC FUNCTION PANEL
ALBUMIN: 2.4 g/dL — AB (ref 3.5–5.0)
ALK PHOS: 105 U/L (ref 38–126)
ALT: 16 U/L — AB (ref 17–63)
AST: 18 U/L (ref 15–41)
Bilirubin, Direct: 0.1 mg/dL (ref 0.1–0.5)
Indirect Bilirubin: 0.6 mg/dL (ref 0.3–0.9)
TOTAL PROTEIN: 6.1 g/dL — AB (ref 6.5–8.1)
Total Bilirubin: 0.7 mg/dL (ref 0.3–1.2)

## 2017-05-24 LAB — CBC
HEMATOCRIT: 35.7 % — AB (ref 39.0–52.0)
Hemoglobin: 11.7 g/dL — ABNORMAL LOW (ref 13.0–17.0)
MCH: 31.6 pg (ref 26.0–34.0)
MCHC: 32.8 g/dL (ref 30.0–36.0)
MCV: 96.5 fL (ref 78.0–100.0)
PLATELETS: 204 10*3/uL (ref 150–400)
RBC: 3.7 MIL/uL — ABNORMAL LOW (ref 4.22–5.81)
RDW: 13.7 % (ref 11.5–15.5)
WBC: 6.9 10*3/uL (ref 4.0–10.5)

## 2017-05-24 LAB — URINALYSIS, ROUTINE W REFLEX MICROSCOPIC
BILIRUBIN URINE: NEGATIVE
Glucose, UA: 150 mg/dL — AB
Hgb urine dipstick: NEGATIVE
KETONES UR: NEGATIVE mg/dL
NITRITE: NEGATIVE
PH: 5 (ref 5.0–8.0)
Protein, ur: 100 mg/dL — AB
Specific Gravity, Urine: 1.014 (ref 1.005–1.030)
Trans Epithel, UA: 2

## 2017-05-24 LAB — HEMOGLOBIN A1C
Hgb A1c MFr Bld: 6.2 % — ABNORMAL HIGH (ref 4.8–5.6)
Mean Plasma Glucose: 131.24 mg/dL

## 2017-05-24 LAB — LIPASE, BLOOD: LIPASE: 27 U/L (ref 11–51)

## 2017-05-24 LAB — TSH: TSH: 3.877 u[IU]/mL (ref 0.350–4.500)

## 2017-05-24 LAB — MRSA PCR SCREENING: MRSA by PCR: NEGATIVE

## 2017-05-24 LAB — BRAIN NATRIURETIC PEPTIDE: B Natriuretic Peptide: 1253.8 pg/mL — ABNORMAL HIGH (ref 0.0–100.0)

## 2017-05-24 LAB — CBG MONITORING, ED: Glucose-Capillary: 121 mg/dL — ABNORMAL HIGH (ref 65–99)

## 2017-05-24 MED ORDER — INSULIN ASPART 100 UNIT/ML ~~LOC~~ SOLN
0.0000 [IU] | Freq: Three times a day (TID) | SUBCUTANEOUS | Status: DC
  Administered 2017-05-25: 2 [IU] via SUBCUTANEOUS
  Administered 2017-05-25 – 2017-05-26 (×3): 1 [IU] via SUBCUTANEOUS

## 2017-05-24 MED ORDER — ACETAMINOPHEN 325 MG PO TABS
650.0000 mg | ORAL_TABLET | Freq: Four times a day (QID) | ORAL | Status: DC | PRN
  Filled 2017-05-24: qty 2

## 2017-05-24 MED ORDER — HEPARIN SODIUM (PORCINE) 5000 UNIT/ML IJ SOLN
5000.0000 [IU] | Freq: Three times a day (TID) | INTRAMUSCULAR | Status: DC
  Administered 2017-05-24 – 2017-05-27 (×8): 5000 [IU] via SUBCUTANEOUS
  Filled 2017-05-24 (×8): qty 1

## 2017-05-24 MED ORDER — ACETAMINOPHEN 650 MG RE SUPP: 650.0000 mg | Freq: Four times a day (QID) | RECTAL | Status: DC | PRN

## 2017-05-24 MED ORDER — HYDROCODONE-ACETAMINOPHEN 5-325 MG PO TABS
1.0000 | ORAL_TABLET | ORAL | Status: DC | PRN
  Administered 2017-05-24 – 2017-05-26 (×2): 1 via ORAL
  Filled 2017-05-24 (×2): qty 1

## 2017-05-24 MED ORDER — ONDANSETRON HCL 4 MG/2ML IJ SOLN
4.0000 mg | Freq: Four times a day (QID) | INTRAMUSCULAR | Status: DC | PRN
  Administered 2017-05-28: 4 mg via INTRAVENOUS
  Filled 2017-05-24: qty 2

## 2017-05-24 MED ORDER — FUROSEMIDE 10 MG/ML IJ SOLN
80.0000 mg | Freq: Every day | INTRAMUSCULAR | Status: DC
  Administered 2017-05-24 – 2017-05-25 (×2): 80 mg via INTRAVENOUS
  Filled 2017-05-24 (×2): qty 8

## 2017-05-24 MED ORDER — POLYETHYLENE GLYCOL 3350 17 G PO PACK
17.0000 g | PACK | Freq: Every day | ORAL | Status: DC | PRN
  Administered 2017-05-29: 17 g via ORAL
  Filled 2017-05-24: qty 1

## 2017-05-24 MED ORDER — FENTANYL CITRATE (PF) 100 MCG/2ML IJ SOLN
50.0000 ug | Freq: Once | INTRAMUSCULAR | Status: AC
  Administered 2017-05-24: 50 ug via INTRAVENOUS
  Filled 2017-05-24: qty 2

## 2017-05-24 MED ORDER — VANCOMYCIN HCL 10 G IV SOLR
2000.0000 mg | Freq: Once | INTRAVENOUS | Status: AC
  Administered 2017-05-24: 2000 mg via INTRAVENOUS
  Filled 2017-05-24: qty 2000

## 2017-05-24 MED ORDER — DEXTROSE 5 % IV SOLN
1.0000 g | Freq: Once | INTRAVENOUS | Status: AC
  Administered 2017-05-24: 1 g via INTRAVENOUS
  Filled 2017-05-24: qty 1

## 2017-05-24 MED ORDER — DEXTROSE 5 % IV SOLN
500.0000 mg | INTRAVENOUS | Status: DC
  Filled 2017-05-24: qty 0.5

## 2017-05-24 MED ORDER — ONDANSETRON HCL 4 MG PO TABS: 4.0000 mg | ORAL_TABLET | Freq: Four times a day (QID) | ORAL | Status: DC | PRN

## 2017-05-24 NOTE — ED Notes (Signed)
Pt. returned from XR. 

## 2017-05-24 NOTE — H&P (Signed)
History and Physical    Almando Brawley ZOX:[id number] DOB: 10-20-1950 DOA: 05/24/2017   PCP: Patient, No Pcp Per   Patient coming from:  Home    Chief Complaint: Shortness of breath  HPI: Nicholas Gutierrez is a 67 y.o. male with  recent medical history significant for DM, CKD stage 5, history of glomerulosclerosis,  long admission from 12/26 to [facility], then transferred to [facility] on 05/11/2017 till 05/17/2016 with acute renal failure.  At the time, he had been diagnosed with hypertensive urgency, type 2 diabetes, and nephropathy, he also was noted to have an adrenal mass, acute renal arterial thrombosis, nephrotic syndrome with hypoalbuminemia, chronic glomerulonephritis, and as mentioned CKD.  He was supposed to have a nephrologist evaluation on 06/03/2017 to discuss HD cath insertion  He is presenting to the ED with progressive shortness of breath since his discharge, stating that he continues to have low amount of urine, feeling one cup at the mouth, despite the use of Lasix.  He also complains of worsening lower extremity edema, which does not allow him to ambulate properly, stating the legs are too heavy.  In addition, he cannot ambulate more than from bed to the bathroom without becoming very short of breath.  He reports orthopnea as well.  He is sleeping with more than 2-3 pillows.  He cannot lie down.    Denies rhinorrhea or hemoptysis. Denies fevers, chills, night sweats or mucositis.   Denies any chest pain, chest wall pain or palpitations.  He is reporting back pain, as well as increasing abdominal girth.. Has decreased appetite due to current symptoms  No confusion was reported. Denies any vision changes, double vision or headaches.  Patient was placed on 2 L of oxygen, and his O2 are maintaining at 97 in 2 L .  Patient was compliant with his medications.  He denies any tobacco, alcohol, or recreational drug use.  ED Course:  BP 140/79   Pulse 62   Temp 98.6 F (37 C) (Oral)   Resp  14   SpO2 94%   Sodium 136, potassium 5.3.  Chloride 107.  Bicarb 18.  Glucose 157 with anion gap 11.Marland Kitchen  BUN 19, creatinine 5.58 GFR 10.  Calcium is 8.1. BNP 1253.8 Troponin 0 0.01, with repeat 0.01. White count 6.9, hemoglobin 11.7, and platelets 204. UA is pending. CT Large bilateral pleural effusions.Patchy consolidations throughout both lungs suspicious for pneumonia. Minimal ascites in the abdomen. No acute abnormality identified in the abdomen and pelvis   Review of Systems:  As per HPI otherwise all other systems reviewed and are negative  Past Medical History:  Diagnosis Date  . CHF (congestive heart failure) (HCC)   . Diabetes (HCC)     History reviewed. No pertinent surgical history.  Social History Social History   Socioeconomic History  . Marital status: Married    Spouse name: Not on file  . Number of children: Not on file  . Years of education: Not on file  . Highest education level: Not on file  Social Needs  . Financial resource strain: Not on file  . Food insecurity - worry: Not on file  . Food insecurity - inability: Not on file  . Transportation needs - medical: Not on file  . Transportation needs - non-medical: Not on file  Occupational History  . Occupation: factory  Tobacco Use  . Smoking status: Never Smoker  . Smokeless tobacco: Never Used  Substance and Sexual Activity  . Alcohol use: No  Frequency: Never  . Drug use: No  . Sexual activity: Not on file  Other Topics Concern  . Not on file  Social History Narrative  . Not on file     No Known Allergies  History reviewed. No pertinent family history.    Prior to Admission medications   Medication Sig Start Date End Date Taking? Authorizing Provider  carvedilol (COREG) 12.5 MG tablet Take 1 tablet (12.5 mg total) by mouth 2 (two) times daily with a meal. 05/17/17  Yes Alwyn RenMathews, Elizabeth G, MD  furosemide (LASIX) 40 MG tablet Take 1 tablet (40 mg total) by mouth daily. 05/18/17  Yes  Alwyn RenMathews, Elizabeth G, MD  hydrALAZINE (APRESOLINE) 100 MG tablet Take 1 tablet (100 mg total) by mouth every 8 (eight) hours. 05/17/17  Yes Alwyn RenMathews, Elizabeth G, MD  insulin aspart protamine- aspart (NOVOLOG MIX 70/30) (70-30) 100 UNIT/ML injection Inject 0.12 mLs (12 Units total) into the skin 2 (two) times daily with a meal. 05/17/17  Yes Alwyn RenMathews, Elizabeth G, MD  isosorbide mononitrate (IMDUR) 30 MG 24 hr tablet Take 3 tablets (90 mg total) by mouth daily. 05/18/17  Yes Alwyn RenMathews, Elizabeth G, MD  polyethylene glycol [facility](MIRALAX / Ethelene HalGLYCOLAX) packet Take 17 g by mouth daily. 05/18/17  Yes Alwyn RenMathews, Elizabeth G, MD  Vitamin D, Ergocalciferol, (DRISDOL) 50000 units CAPS capsule Take 1 capsule (50,000 Units total) by mouth every 7 (seven) days. 05/23/17  Yes Alwyn RenMathews, Elizabeth G, MD    Physical Exam:  Vitals:   05/24/17 1515 05/24/17 1545 05/24/17 1600 05/24/17 1615  BP: 135/81 139/78 138/81 140/79  Pulse: 62 62 63 62  Resp: 14 16 15 14   Temp:      TempSrc:      SpO2: 97% 98% 96% 94%   Constitutional: Ill appearing, appears in moderate degree of distress due to shortness of breath and fluid overload, alert, able to speak small sentences. Eyes: PERRL, lids and conjunctivae normal ENMT: Mucous membranes are moist, without exudate or lesions  Neck: normal, supple, no masses, no thyromegaly.  Mild JVD noted Respiratory: Bilateral rales, decreased breath sounds at the bases, poor inspiratory effort, possible rub on the left. Cardiovascular: Regular rate and rhythm,  murmur, rubs or gallops.  3 + bilateral extremity edema. 2+ pedal pulses. No carotid bruits.  Abdomen: Obese, distended, no guarding, no CVA T, some fluid wave noted.   Bowel sounds positive.  Musculoskeletal: no clubbing / cyanosis. Moves all extremities Skin: no jaundice, No lesions.  Neurologic: Sensation intact  Strength equal in all extremities Psychiatric:   Alert and oriented x 3 somewhat anxious due to shortness of breath.    Labs on  Admission: I have personally reviewed following labs and imaging studies  CBC: Recent Labs  Lab 05/24/17 1154  WBC 6.9  HGB 11.7*  HCT 35.7*  MCV 96.5  PLT 204    Basic Metabolic Panel: Recent Labs  Lab 05/24/17 1154  NA 136  K 5.3*  CL 107  CO2 18*  GLUCOSE 157*  BUN 90*  CREATININE 5.58*  CALCIUM 8.1*    GFR: Estimated Creatinine Clearance: 14.1 mL/min (A) (by C-G formula based on SCr of 5.58 mg/dL (H)).  Liver Function Tests: No results for input(s): AST, ALT, ALKPHOS, BILITOT, PROT, ALBUMIN in the last 168 hours. No results for input(s): LIPASE, AMYLASE in the last 168 hours. No results for input(s): AMMONIA in the last 168 hours.  Coagulation Profile: No results for input(s): INR, PROTIME in the last 168 hours.  Cardiac  Enzymes: No results for input(s): CKTOTAL, CKMB, CKMBINDEX, TROPONINI in the last 168 hours.  BNP (last 3 results) No results for input(s): PROBNP in the last 8760 hours.  HbA1C: No results for input(s): HGBA1C in the last 72 hours.  CBG: Recent Labs  Lab 05/24/17 1317  GLUCAP 121*    Lipid Profile: No results for input(s): CHOL, HDL, LDLCALC, TRIG, CHOLHDL, LDLDIRECT in the last 72 hours.  Thyroid Function Tests: No results for input(s): TSH, T4TOTAL, FREET4, T3FREE, THYROIDAB in the last 72 hours.  Anemia Panel: No results for input(s): VITAMINB12, FOLATE, FERRITIN, TIBC, IRON, RETICCTPCT in the last 72 hours.  Urine analysis:    Component Value Date/Time   COLORURINE YELLOW 05/24/2017 1533   APPEARANCEUR CLEAR 05/24/2017 1533   LABSPEC 1.014 05/24/2017 1533   PHURINE 5.0 05/24/2017 1533   GLUCOSEU 150 (A) 05/24/2017 1533   HGBUR NEGATIVE 05/24/2017 1533   BILIRUBINUR NEGATIVE 05/24/2017 1533   KETONESUR NEGATIVE 05/24/2017 1533   PROTEINUR 100 (A) 05/24/2017 1533   NITRITE NEGATIVE 05/24/2017 1533   LEUKOCYTESUR SMALL (A) 05/24/2017 1533    Sepsis Labs: @LABRCNTIP (procalcitonin:4,lacticidven:4) )No results found  for this or any previous visit (from the past 240 hour(s)).   Radiological Exams on Admission: Ct Abdomen Pelvis Wo Contrast  Result Date: 05/24/2017 CLINICAL DATA:  Patient has a history of congestive heart failure. Increasing abdominal distension in the last few days. EXAM: CT CHEST AND ABDOMEN WITHOUT CONTRAST TECHNIQUE: Multidetector CT imaging of the chest and abdomen was performed following the standard protocol without intravenous contrast. COMPARISON:  None. FINDINGS: CT CHEST FINDINGS WITHOUT CONTRAST Cardiovascular: Atherosclerosis of the aorta is identified. The heart size is enlarged. There is no pericardial effusion. Mediastinum/Nodes: There are mild prominent mediastinal and hilar lymph nodes largest measuring 1 cm. These may be reactive lymph nodes. The trachea is normal. The esophagus is unremarkable. The thyroid glands are normal. Lungs/Pleura: There are large bilateral pleural effusions. There are patchy consolidations most prominently involving the bilateral lower lobes but also to a lesser degree involves the right upper lobe and right middle lobe. Musculoskeletal: No acute abnormality. CT ABDOMEN FINDINGS WITHOUT CONTRAST Hepatobiliary: Left lobe liver is enlarged with mild nodular contour of liver suggesting cirrhosis of liver. No focal liver lesion is identified. The gallbladder is normal. Minimal ascites is noted surrounding gallbladder. The biliary tree is normal. Pancreas: Unremarkable. No pancreatic ductal dilatation or surrounding inflammatory changes. Spleen: Normal in size without focal abnormality. Adrenals/Urinary Tract: Adrenal glands are unremarkable. Kidneys are normal, without renal calculi, focal lesion, or hydronephrosis. Minimal bilateral perinephric fluid is identified. Bladder is unremarkable. Stomach/Bowel: Stomach is within normal limits. Appendix appears normal. No evidence of bowel wall thickening, distention, or inflammatory changes. Vascular/Lymphatic: Aortic  atherosclerosis. No enlarged abdominal or pelvic lymph nodes. Other: Minimal ascites is identified in the abdomen. Musculoskeletal: No acute abnormality is noted. IMPRESSION: Large bilateral pleural effusions. Patchy consolidations throughout both lungs suspicious for pneumonia. Minimal ascites in the abdomen. No acute abnormality identified in the abdomen and pelvis. Electronically Signed   By: Sherian Rein M.D.   On: 05/24/2017 15:04   Dg Chest 2 View  Result Date: 05/24/2017 CLINICAL DATA:  Shortness of breath, chest pain and history of CHF. EXAM: CHEST  2 VIEW COMPARISON:  12/01/2006 FINDINGS: The heart size and mediastinal contours are within normal limits. There is evidence mild to moderate pulmonary edema and probable small bilateral pleural effusions. Atelectasis present at both lung bases. The visualized skeletal structures are unremarkable. IMPRESSION: Mild  to moderate pulmonary edema. Electronically Signed   By: Irish Lack M.D.   On: 05/24/2017 12:41   Ct Chest Wo Contrast  Result Date: 05/24/2017 CLINICAL DATA:  Patient has a history of congestive heart failure. Increasing abdominal distension in the last few days. EXAM: CT CHEST AND ABDOMEN WITHOUT CONTRAST TECHNIQUE: Multidetector CT imaging of the chest and abdomen was performed following the standard protocol without intravenous contrast. COMPARISON:  None. FINDINGS: CT CHEST FINDINGS WITHOUT CONTRAST Cardiovascular: Atherosclerosis of the aorta is identified. The heart size is enlarged. There is no pericardial effusion. Mediastinum/Nodes: There are mild prominent mediastinal and hilar lymph nodes largest measuring 1 cm. These may be reactive lymph nodes. The trachea is normal. The esophagus is unremarkable. The thyroid glands are normal. Lungs/Pleura: There are large bilateral pleural effusions. There are patchy consolidations most prominently involving the bilateral lower lobes but also to a lesser degree involves the right upper lobe  and right middle lobe. Musculoskeletal: No acute abnormality. CT ABDOMEN FINDINGS WITHOUT CONTRAST Hepatobiliary: Left lobe liver is enlarged with mild nodular contour of liver suggesting cirrhosis of liver. No focal liver lesion is identified. The gallbladder is normal. Minimal ascites is noted surrounding gallbladder. The biliary tree is normal. Pancreas: Unremarkable. No pancreatic ductal dilatation or surrounding inflammatory changes. Spleen: Normal in size without focal abnormality. Adrenals/Urinary Tract: Adrenal glands are unremarkable. Kidneys are normal, without renal calculi, focal lesion, or hydronephrosis. Minimal bilateral perinephric fluid is identified. Bladder is unremarkable. Stomach/Bowel: Stomach is within normal limits. Appendix appears normal. No evidence of bowel wall thickening, distention, or inflammatory changes. Vascular/Lymphatic: Aortic atherosclerosis. No enlarged abdominal or pelvic lymph nodes. Other: Minimal ascites is identified in the abdomen. Musculoskeletal: No acute abnormality is noted. IMPRESSION: Large bilateral pleural effusions. Patchy consolidations throughout both lungs suspicious for pneumonia. Minimal ascites in the abdomen. No acute abnormality identified in the abdomen and pelvis. Electronically Signed   By: Sherian Rein M.D.   On: 05/24/2017 15:04    EKG: Independently reviewed.  Assessment/Plan Active Problems:   Type 2 diabetes with nephropathy (HCC)   Acute diastolic CHF (congestive heart failure) (HCC)   BPH with urinary obstruction   Adrenal mass (HCC)   Chronic kidney disease (CKD), stage V (HCC)   Healthcare-associated pneumonia   Acute respiratory failure (HCC)   Acute exacerbation of CHF (congestive heart failure) (HCC)    Acute hypoxic respiratory distress likely secondary to acute  diastolic CHF , in a patient with fluid overload due to CKD stage V (see below ) CT Large bilateral pleural effusions.Patchy consolidations throughout both lungs  suspicious for pneumonia. Minimal ascites in the abdomen. No acute abnormality identified in the abdomen and pelvis.BNP 1253.8.  Troponin negative x2.  A 2D echopending.  Bicarb is 18 .current weight is 104.1 kg, 229 pounds.  Because of fluid overload in the setting of CKD 5, no IV Lasix was given at the ED.  Stepdown inpatient  CHF  order set  2 D echo  Strict I/Os O2 prn Holding Coreg, Hydralazine and Imdur as patient took his daily dose and could be having HD today . If no HD this evening, will resume his night meds, and hold in am for possible dialysis (current BP 140/79 P62)   Pneumonia per chest x-ray, likely HCAP Patient is afebrile.  CXR concerning for patchy consolidation suspicious for pneumonia  WBC normal at 6.9 Maxipime and Vanco  was received in the ED. could not provide IV fluids due to fluid overload due to  above. Pneumonia order set  IV antibiotics Maxipime and Vanco  CBC in a.m. Follow-up on cultures   Hypertension BP 140/79   Pulse 62   Temp 98.6 F (37 C) (Oral)   Resp 14   SpO2 94%  Controlled Continue home anti-hypertensive medications  Add Hydralazine Q6 hours as needed for BP 160/90    Type II Diabetes Current blood sugar level is 157  No results found for: HGBA1C Hgb A1C SSI  Chronic kidney disease stage 5  /history of chronic glomerulonephritis, biopsy-proven/nephrotic syndrome with hypoalbuminemia/history of acute renal arterial thrombosis.  He was to follow-up with Dr. Hyman Hopes on January 22 at 2:30 PM, but upon admission, will obtain nephrology consult, for urgent dialysis in view of patient being uremic.  Current creatinine 5.58, BUN 90 UA neg nitrites, small leuk . GFR 10 Lab Results  Component Value Date   CREATININE 5.58 (H) 05/24/2017   CREATININE 4.99 (H) 05/17/2017   CREATININE 5.10 (H) 05/16/2017   CREATININE 5.09 (H) 05/16/2017  Hold diuretics Follow-up recommendations from nephrology  repeat BMET in am  Hold NSAIDS   Benign prostatic  hypertrophy,  no acute issues continue  Adrenal mass, per CT A/P  To follow with endocrinology as an outpatient.   DVT prophylaxis: Heparin Code Status:    Full Family Communication:  Discussed with patient wife Disposition Plan: Expect patient to be discharged to home after condition improves Consults called:    Nephrology, Dr. Arlean Hopping, spoke with Dr. Rush Landmark, EDP, and agreed  to see the patient today regarding his renal issues, likely to have a urgent dialysis. Admission status: Stepdown patient   Marlowe Kays, PA-C [facility]   Amion text  [phone]   05/24/2017, 4:42 PM

## 2017-05-24 NOTE — ED Notes (Signed)
Patient transported to X-ray 

## 2017-05-24 NOTE — Plan of Care (Signed)
Pt has not had any problems with urinary output. Pt has had adequate urine output during the shift.  Pt was educated on why he is here and his plan of care during this admission.

## 2017-05-24 NOTE — ED Triage Notes (Signed)
Pt presents to Ed after being d/c'd on 1/5 for new onset CHF.  Pt has been taking his Lasix and Carvedilol since d/c, but continues to have bilateral leg swelling, SOB, fatigue.

## 2017-05-24 NOTE — ED Notes (Signed)
Messaged pharmacy for missing antibiotics

## 2017-05-24 NOTE — Progress Notes (Signed)
Pharmacy Antibiotic Note  Nicholas Gutierrez is a 67 y.o. male admitted on 05/24/2017 with pneumonia.  CKD 5 - not on HD  Plan: Cefepime x 1  Vanc 2 g x 1 Monitor renal fx, cx, vanc lvls prn   Temp (24hrs), Avg:98.6 F (37 C), Min:98.6 F (37 C), Max:98.6 F (37 C)  Recent Labs  Lab 05/24/17 1154  WBC 6.9  CREATININE 5.58*    Estimated Creatinine Clearance: 14.1 mL/min (A) (by C-G formula based on SCr of 5.58 mg/dL (H)).    No Known Allergies  Nicholas Gutierrez, PharmD, BCPS, BCCCP Clinical Pharmacist Clinical phone for 05/24/2017 from 7a-3:30p: [phone] If after 3:30p, please call main pharmacy at: [phone] 05/24/2017 3:52 PM

## 2017-05-24 NOTE — Progress Notes (Signed)
Pharmacy Antibiotic Note  Nicholas Gutierrez is a 67 y.o. male admitted on 05/24/2017 with pneumonia.  CKD 5 - not on HD  Plan: Cefepime x 1  Vanc 2 g x 1 Monitor renal fx, cx, vanc lvls prn   Temp (24hrs), Avg:98.6 F (37 C), Min:98.6 F (37 C), Max:98.6 F (37 C)  Recent Labs  Lab 05/24/17 1154  WBC 6.9  CREATININE 5.58*    Estimated Creatinine Clearance: 14.1 mL/min (A) (by C-G formula based on SCr of 5.58 mg/dL (H)).    No Known Allergies  Nicholas BlissMichael Gutierrez, PharmD, BCPS, BCCCP Clinical Pharmacist Clinical phone for 05/24/2017 from 7a-3:30p: [phone] If after 3:30p, please call main pharmacy at: [phone] 05/24/2017 5:31 PM  ADDENDUM NormCrCl is ~13 mL/min. Will order cefepime 500 mg every 24 hours starting tomorrow (already received 1 gram dose today). Will dose by level for vancomycin- first dose already ordered.  Follow up on renal function and plans from nephrology.   Nicholas CooterKimberly Gutierrez, PharmD Clinical Pharmacist  Pager: [phone] Phone: [phone]

## 2017-05-24 NOTE — ED Notes (Signed)
Patient transported to CT 

## 2017-05-24 NOTE — ED Provider Notes (Signed)
[facility] PROGRESSIVE CARE Provider Note   CSN: [id number] Arrival date & time: 05/24/17  1142     History   Chief Complaint Chief Complaint  Patient presents with  . Congestive Heart Failure   Translator was used during history taking  HPI Cadell Gabrielson is a 67 y.o. male.  HPI   Pt is 67 year old male with history of acute diastolic CHF, diabetes, CKD 5, chronic GN, glomerulosclerosis who presented to the ED with multiple complaints. Pt states that since he was d/c'ed from the hospital on 1/5 he has had increasing fatigue, worsening shortness of breath, worsening 8/10 upper and lower back pain that radiates to his neck, abdominal distention, generalized abdominal pain, nausea, diarrhea, dysuria, and worsening lower extremity swelling despite medication compliance. He denies any CP, fevers, URI sxs, cough, vomiting, constipation, hematuria, or blood in stool.  On review of prior records patient presented to [facility] on 12/26 and was later transferred to [facility] and admitted on 12/30 for acute renal failure. Was diagnosed with HTN urgency, T2DM with nephropathy, adrenal mass, acute renal arterial thrombosis, nephrotic syndrome with hypoalbuminemia, chronic glomerulonephritis, and CKD 5. He has an appt with his nephrologist, Dr. Hyman Hopes on 1/22.  Not currently undergoing dialysis.   Past Medical History:  Diagnosis Date  . CHF (congestive heart failure) (HCC)   . Chronic kidney disease   . Diabetes (HCC)   . Dyspnea     Patient Active Problem List   Diagnosis Date Noted  . Chronic kidney disease (CKD), stage V (HCC) 05/24/2017  . Healthcare-associated pneumonia 05/24/2017  . Acute respiratory failure (HCC) 05/24/2017  . Acute exacerbation of CHF (congestive heart failure) (HCC) 05/24/2017  . Hypertensive urgency 05/11/2017  . Type 2 diabetes with nephropathy (HCC) 05/11/2017  . Obesity 05/11/2017  . Acute diastolic CHF (congestive heart failure) (HCC) 05/11/2017    . Elevated d-dimer 05/11/2017  . BPH with urinary obstruction 05/11/2017  . Adrenal mass (HCC) 05/11/2017  . Acute renal arterial thrombosis (HCC) 05/11/2017    Past Surgical History:  Procedure Laterality Date  . NO PAST SURGERIES         Home Medications    Prior to Admission medications   Medication Sig Start Date End Date Taking? Authorizing Provider  carvedilol (COREG) 12.5 MG tablet Take 1 tablet (12.5 mg total) by mouth 2 (two) times daily with a meal. 05/17/17  Yes Alwyn Ren, MD  furosemide (LASIX) 40 MG tablet Take 1 tablet (40 mg total) by mouth daily. 05/18/17  Yes Alwyn Ren, MD  hydrALAZINE (APRESOLINE) 100 MG tablet Take 1 tablet (100 mg total) by mouth every 8 (eight) hours. 05/17/17  Yes Alwyn Ren, MD  insulin aspart protamine- aspart (NOVOLOG MIX 70/30) (70-30) 100 UNIT/ML injection Inject 0.12 mLs (12 Units total) into the skin 2 (two) times daily with a meal. 05/17/17  Yes Alwyn Ren, MD  isosorbide mononitrate (IMDUR) 30 MG 24 hr tablet Take 3 tablets (90 mg total) by mouth daily. 05/18/17  Yes Alwyn Ren, MD  polyethylene glycol [facility] / Ethelene Hal) packet Take 17 g by mouth daily. 05/18/17  Yes Alwyn Ren, MD  Vitamin D, Ergocalciferol, (DRISDOL) 50000 units CAPS capsule Take 1 capsule (50,000 Units total) by mouth every 7 (seven) days. 05/23/17  Yes Alwyn Ren, MD    Family History History reviewed. No pertinent family history.  Social History Social History   Tobacco Use  . Smoking status: Never Smoker  .  Smokeless tobacco: Never Used  Substance Use Topics  . Alcohol use: No    Frequency: Never  . Drug use: No     Allergies   Patient has no known allergies.   Review of Systems Review of Systems  Constitutional: Negative for chills and fever.  HENT: Negative for congestion, ear pain, postnasal drip, rhinorrhea, sinus pain and sore throat.   Eyes: Negative for pain and visual  disturbance.  Respiratory: Positive for shortness of breath. Negative for cough.   Cardiovascular: Positive for leg swelling. Negative for chest pain and palpitations.  Gastrointestinal: Positive for abdominal distention, abdominal pain and nausea. Negative for blood in stool, constipation, diarrhea and vomiting.  Genitourinary: Positive for dysuria. Negative for frequency and hematuria.  Musculoskeletal: Positive for back pain. Negative for arthralgias.  Skin: Negative for color change and rash.  Neurological: Negative for seizures and syncope.  All other systems reviewed and are negative.    Physical Exam Updated Vital Signs BP (!) 160/87 (BP Location: Left Arm)   Pulse 66   Temp 98.6 F (37 C) (Oral)   Resp 20   Ht 5\' 2"  (1.575 m)   Wt 104.8 kg (231 lb)   SpO2 97%   BMI 42.25 kg/m   Physical Exam  Constitutional: He appears well-developed and well-nourished. He appears distressed.  HENT:  Head: Normocephalic and atraumatic.  Nose: Nose normal.  Mouth/Throat: Oropharynx is clear and moist. No oropharyngeal exudate.  Eyes: Conjunctivae and EOM are normal. Pupils are equal, round, and reactive to light.  Neck: Normal range of motion. Neck supple.  Cardiovascular: Normal rate, regular rhythm and intact distal pulses.  No murmur heard. Pulmonary/Chest: Effort normal. No stridor. He has wheezes (diffuse, throughout). He has rales (bilat lower lungs).  tachypneic  Abdominal: Soft. Bowel sounds are normal. He exhibits distension. He exhibits no mass. There is tenderness (diffuse). There is no guarding.  No CVA TTP.  Musculoskeletal: He exhibits edema (3+ pitting to BLE up to knees).  No midline or paraspinous TTP. TTP to bilat trapezius musculature  Lymphadenopathy:    He has no cervical adenopathy.  Neurological: He is alert.  Skin: Skin is warm and dry.  Psychiatric: He has a normal mood and affect.  Nursing note and vitals reviewed.    ED Treatments / Results   Labs (all labs ordered are listed, but only abnormal results are displayed) Labs Reviewed  BASIC METABOLIC PANEL - Abnormal; Notable for the following components:      Result Value   Potassium 5.3 (*)    CO2 18 (*)    Glucose, Bld 157 (*)    BUN 90 (*)    Creatinine, Ser 5.58 (*)    Calcium 8.1 (*)    GFR calc non Af Amer 10 (*)    GFR calc Af Amer 11 (*)    All other components within normal limits  CBC - Abnormal; Notable for the following components:   RBC 3.70 (*)    Hemoglobin 11.7 (*)    HCT 35.7 (*)    All other components within normal limits  BRAIN NATRIURETIC PEPTIDE - Abnormal; Notable for the following components:   B Natriuretic Peptide 1,253.8 (*)    All other components within normal limits  URINALYSIS, ROUTINE W REFLEX MICROSCOPIC - Abnormal; Notable for the following components:   Glucose, UA 150 (*)    Protein, ur 100 (*)    Leukocytes, UA SMALL (*)    Bacteria, UA RARE (*)  Squamous Epithelial / LPF 0-5 (*)    All other components within normal limits  HEPATIC FUNCTION PANEL - Abnormal; Notable for the following components:   Total Protein 6.1 (*)    Albumin 2.4 (*)    ALT 16 (*)    All other components within normal limits  HEMOGLOBIN A1C - Abnormal; Notable for the following components:   Hgb A1c MFr Bld 6.2 (*)    All other components within normal limits  CBG MONITORING, ED - Abnormal; Notable for the following components:   Glucose-Capillary 121 (*)    All other components within normal limits  I-STAT VENOUS BLOOD GAS, ED - Abnormal; Notable for the following components:   pCO2, Ven 38.4 (*)    pO2, Ven 58.0 (*)    Bicarbonate 19.9 (*)    TCO2 21 (*)    Acid-base deficit 6.0 (*)    All other components within normal limits  URINE CULTURE  CULTURE, BLOOD (ROUTINE X 2)  CULTURE, BLOOD (ROUTINE X 2)  CULTURE, EXPECTORATED SPUTUM-ASSESSMENT  GRAM STAIN  MRSA PCR SCREENING  LIPASE, BLOOD  TSH  GLUCOSE, CAPILLARY  BASIC METABOLIC PANEL   CBC  STREP PNEUMONIAE URINARY ANTIGEN  I-STAT TROPONIN, ED  I-STAT TROPONIN, ED    EKG  EKG Interpretation  Date/Time:  Saturday May 24 2017 12:00:15 EST Ventricular Rate:  61 PR Interval:  130 QRS Duration: 120 QT Interval:  426 QTC Calculation: 428 R Axis:   109 Text Interpretation:  Normal sinus rhythm Low voltage QRS Right bundle branch block Abnormal ECG No prior ECG for comparison.  No STEMI Confirmed by Theda Belfast ([id number]) on 05/24/2017 12:58:48 PM       Radiology Ct Abdomen Pelvis Wo Contrast  Result Date: 05/24/2017 CLINICAL DATA:  Patient has a history of congestive heart failure. Increasing abdominal distension in the last few days. EXAM: CT CHEST AND ABDOMEN WITHOUT CONTRAST TECHNIQUE: Multidetector CT imaging of the chest and abdomen was performed following the standard protocol without intravenous contrast. COMPARISON:  None. FINDINGS: CT CHEST FINDINGS WITHOUT CONTRAST Cardiovascular: Atherosclerosis of the aorta is identified. The heart size is enlarged. There is no pericardial effusion. Mediastinum/Nodes: There are mild prominent mediastinal and hilar lymph nodes largest measuring 1 cm. These may be reactive lymph nodes. The trachea is normal. The esophagus is unremarkable. The thyroid glands are normal. Lungs/Pleura: There are large bilateral pleural effusions. There are patchy consolidations most prominently involving the bilateral lower lobes but also to a lesser degree involves the right upper lobe and right middle lobe. Musculoskeletal: No acute abnormality. CT ABDOMEN FINDINGS WITHOUT CONTRAST Hepatobiliary: Left lobe liver is enlarged with mild nodular contour of liver suggesting cirrhosis of liver. No focal liver lesion is identified. The gallbladder is normal. Minimal ascites is noted surrounding gallbladder. The biliary tree is normal. Pancreas: Unremarkable. No pancreatic ductal dilatation or surrounding inflammatory changes. Spleen: Normal in size without  focal abnormality. Adrenals/Urinary Tract: Adrenal glands are unremarkable. Kidneys are normal, without renal calculi, focal lesion, or hydronephrosis. Minimal bilateral perinephric fluid is identified. Bladder is unremarkable. Stomach/Bowel: Stomach is within normal limits. Appendix appears normal. No evidence of bowel wall thickening, distention, or inflammatory changes. Vascular/Lymphatic: Aortic atherosclerosis. No enlarged abdominal or pelvic lymph nodes. Other: Minimal ascites is identified in the abdomen. Musculoskeletal: No acute abnormality is noted. IMPRESSION: Large bilateral pleural effusions. Patchy consolidations throughout both lungs suspicious for pneumonia. Minimal ascites in the abdomen. No acute abnormality identified in the abdomen and pelvis. Electronically Signed   By:  Sherian ReinWei-Chen  Lin M.D.   On: 05/24/2017 15:04   Dg Chest 2 View  Result Date: 05/24/2017 CLINICAL DATA:  Shortness of breath, chest pain and history of CHF. EXAM: CHEST  2 VIEW COMPARISON:  12/01/2006 FINDINGS: The heart size and mediastinal contours are within normal limits. There is evidence mild to moderate pulmonary edema and probable small bilateral pleural effusions. Atelectasis present at both lung bases. The visualized skeletal structures are unremarkable. IMPRESSION: Mild to moderate pulmonary edema. Electronically Signed   By: Irish LackGlenn  Yamagata M.D.   On: 05/24/2017 12:41   Ct Chest Wo Contrast  Result Date: 05/24/2017 CLINICAL DATA:  Patient has a history of congestive heart failure. Increasing abdominal distension in the last few days. EXAM: CT CHEST AND ABDOMEN WITHOUT CONTRAST TECHNIQUE: Multidetector CT imaging of the chest and abdomen was performed following the standard protocol without intravenous contrast. COMPARISON:  None. FINDINGS: CT CHEST FINDINGS WITHOUT CONTRAST Cardiovascular: Atherosclerosis of the aorta is identified. The heart size is enlarged. There is no pericardial effusion. Mediastinum/Nodes:  There are mild prominent mediastinal and hilar lymph nodes largest measuring 1 cm. These may be reactive lymph nodes. The trachea is normal. The esophagus is unremarkable. The thyroid glands are normal. Lungs/Pleura: There are large bilateral pleural effusions. There are patchy consolidations most prominently involving the bilateral lower lobes but also to a lesser degree involves the right upper lobe and right middle lobe. Musculoskeletal: No acute abnormality. CT ABDOMEN FINDINGS WITHOUT CONTRAST Hepatobiliary: Left lobe liver is enlarged with mild nodular contour of liver suggesting cirrhosis of liver. No focal liver lesion is identified. The gallbladder is normal. Minimal ascites is noted surrounding gallbladder. The biliary tree is normal. Pancreas: Unremarkable. No pancreatic ductal dilatation or surrounding inflammatory changes. Spleen: Normal in size without focal abnormality. Adrenals/Urinary Tract: Adrenal glands are unremarkable. Kidneys are normal, without renal calculi, focal lesion, or hydronephrosis. Minimal bilateral perinephric fluid is identified. Bladder is unremarkable. Stomach/Bowel: Stomach is within normal limits. Appendix appears normal. No evidence of bowel wall thickening, distention, or inflammatory changes. Vascular/Lymphatic: Aortic atherosclerosis. No enlarged abdominal or pelvic lymph nodes. Other: Minimal ascites is identified in the abdomen. Musculoskeletal: No acute abnormality is noted. IMPRESSION: Large bilateral pleural effusions. Patchy consolidations throughout both lungs suspicious for pneumonia. Minimal ascites in the abdomen. No acute abnormality identified in the abdomen and pelvis. Electronically Signed   By: Sherian ReinWei-Chen  Lin M.D.   On: 05/24/2017 15:04    Procedures Procedures (including critical care time)  Medications Ordered in ED Medications  acetaminophen (TYLENOL) tablet 650 mg (not administered)    Or  acetaminophen (TYLENOL) suppository 650 mg (not  administered)  HYDROcodone-acetaminophen (NORCO/VICODIN) 5-325 MG per tablet 1-2 tablet (1 tablet Oral Given 05/24/17 2128)  ondansetron (ZOFRAN) tablet 4 mg (not administered)    Or  ondansetron (ZOFRAN) injection 4 mg (not administered)  heparin injection 5,000 Units (5,000 Units Subcutaneous Given 05/24/17 2129)  polyethylene glycol (MIRALAX / GLYCOLAX) packet 17 g (not administered)  insulin aspart (novoLOG) injection 0-9 Units (not administered)  ceFEPIme (MAXIPIME) 500 mg in dextrose 5 % 50 mL IVPB (not administered)  furosemide (LASIX) injection 80 mg (80 mg Intravenous Given 05/24/17 2129)  fentaNYL (SUBLIMAZE) injection 50 mcg (50 mcg Intravenous Given 05/24/17 1421)  ceFEPIme (MAXIPIME) 1 g in dextrose 5 % 50 mL IVPB (0 g Intravenous Stopped 05/24/17 1713)  vancomycin (VANCOCIN) 2,000 mg in sodium chloride 0.9 % 500 mL IVPB (0 mg Intravenous Stopped 05/24/17 1958)     Initial Impression / Assessment  and Plan / ED Course  I have reviewed the triage vital signs and the nursing notes.  Pertinent labs & imaging results that were available during my care of the patient were reviewed by me and considered in my medical decision making (see chart for details).   1:00 PM Staffed pt with Dr. Rush Landmark who evaluated the pt in the ED with myself. He agrees with the plan to obtain labs, EKG, CT chest/abd/pelvis, and UA/Ucx.  Patient currently satting at 97% on 2 L.  He requested O2.  Dr. Rush Landmark consulted nephro, Dr. Arta Silence will see the pt.   Dr. Rush Landmark consulted hospitalist, Dr. Raynaldo Opitz who will admit the pt.  Rechecked the patient.  He is satting in the high 90s on 2 L O2.  No tachypnea.  Appears uncomfortable.  Discussed the results of the labs, ECG, imaging and the plan for admission as well as consultation with neurology.  Patient and family are in agreement with this plan.  They have no further questions.  Final Clinical Impressions(s) / ED Diagnoses   Final diagnoses:  Acute on  chronic congestive heart failure, unspecified heart failure type (HCC)  HCAP (healthcare-associated pneumonia)  Acute renal failure superimposed on chronic kidney disease, unspecified CKD stage, unspecified acute renal failure type (HCC)  SOB (shortness of breath)   67 year old male with history of acute diastolic CHF, diabetes, CKD 5, chronic GN, glomerulosclerosis presenting with worsening fatigue, dyspnea, and edema. Fluid overloaded on exam.  BMP with worsening creatinine, low CO2,  and hyperkalemia. CBC with low hgb (baseline). Trop negative. Elevated BNP to 1200.  UA with protein, glucosuria, leukocytes, and bacteriuria. Lipase WNL.  Ucx and blood cultures pending. Chest x-ray with mild to moderate pulmonary edema.  CT chest/abd/pevis with no evidence of AAA.  Did show bilateral pleural effusions, patchy consolidation suspicious for pneumonia, and minimal ascites in the abdomen. Consulted nephrology, Dr. Arta Silence who will see the pt. Admitted to Dr. Raynaldo Opitz.  ED Discharge Orders    None       Rayne Du 05/24/17 2339    Tegeler, Canary Brim, MD 05/25/17 [phone]

## 2017-05-24 NOTE — ED Notes (Signed)
EDP given EKG from triage

## 2017-05-24 NOTE — ED Notes (Signed)
ED Provider at bedside. 

## 2017-05-25 ENCOUNTER — Inpatient Hospital Stay (HOSPITAL_COMMUNITY): Payer: Commercial Managed Care - PPO

## 2017-05-25 DIAGNOSIS — E8779 Other fluid overload: Secondary | ICD-10-CM

## 2017-05-25 DIAGNOSIS — I340 Nonrheumatic mitral (valve) insufficiency: Secondary | ICD-10-CM

## 2017-05-25 LAB — BASIC METABOLIC PANEL
ANION GAP: 11 (ref 5–15)
BUN: 86 mg/dL — AB (ref 6–20)
CO2: 19 mmol/L — ABNORMAL LOW (ref 22–32)
Calcium: 8.4 mg/dL — ABNORMAL LOW (ref 8.9–10.3)
Chloride: 108 mmol/L (ref 101–111)
Creatinine, Ser: 5.35 mg/dL — ABNORMAL HIGH (ref 0.61–1.24)
GFR, EST AFRICAN AMERICAN: 12 mL/min — AB (ref >60)
GFR, EST NON AFRICAN AMERICAN: 10 mL/min — AB (ref >60)
Glucose, Bld: 122 mg/dL — ABNORMAL HIGH (ref 65–99)
POTASSIUM: 5.3 mmol/L — AB (ref 3.5–5.1)
SODIUM: 138 mmol/L (ref 135–145)

## 2017-05-25 LAB — ECHOCARDIOGRAM COMPLETE
HEIGHTINCHES: 62 in
WEIGHTICAEL: 3678.4 [oz_av]

## 2017-05-25 LAB — URINE CULTURE: Culture: NO GROWTH

## 2017-05-25 LAB — IRON AND TIBC
IRON: 36 ug/dL — AB (ref 45–182)
Saturation Ratios: 14 % — ABNORMAL LOW (ref 17.9–39.5)
TIBC: 253 ug/dL (ref 250–450)
UIBC: 217 ug/dL

## 2017-05-25 LAB — FERRITIN: Ferritin: 52 ng/mL (ref 24–336)

## 2017-05-25 LAB — CBC
HCT: 39.3 % (ref 39.0–52.0)
Hemoglobin: 12.3 g/dL — ABNORMAL LOW (ref 13.0–17.0)
MCH: 30.7 pg (ref 26.0–34.0)
MCHC: 31.3 g/dL (ref 30.0–36.0)
MCV: 98 fL (ref 78.0–100.0)
Platelets: 240 10*3/uL (ref 150–400)
RBC: 4.01 MIL/uL — AB (ref 4.22–5.81)
RDW: 13.7 % (ref 11.5–15.5)
WBC: 6 10*3/uL (ref 4.0–10.5)

## 2017-05-25 LAB — GLUCOSE, CAPILLARY
GLUCOSE-CAPILLARY: 117 mg/dL — AB (ref 65–99)
GLUCOSE-CAPILLARY: 119 mg/dL — AB (ref 65–99)
GLUCOSE-CAPILLARY: 131 mg/dL — AB (ref 65–99)
GLUCOSE-CAPILLARY: 159 mg/dL — AB (ref 65–99)
GLUCOSE-CAPILLARY: 167 mg/dL — AB (ref 65–99)
GLUCOSE-CAPILLARY: 96 mg/dL (ref 65–99)

## 2017-05-25 LAB — STREP PNEUMONIAE URINARY ANTIGEN: Strep Pneumo Urinary Antigen: NEGATIVE

## 2017-05-25 MED ORDER — METOLAZONE 5 MG PO TABS
5.0000 mg | ORAL_TABLET | Freq: Two times a day (BID) | ORAL | Status: DC
  Administered 2017-05-25 – 2017-05-27 (×4): 5 mg via ORAL
  Filled 2017-05-25 (×4): qty 1

## 2017-05-25 MED ORDER — FUROSEMIDE 10 MG/ML IJ SOLN
160.0000 mg | Freq: Four times a day (QID) | INTRAVENOUS | Status: DC
  Administered 2017-05-25 – 2017-05-27 (×9): 160 mg via INTRAVENOUS
  Filled 2017-05-25 (×2): qty 16
  Filled 2017-05-25: qty 10
  Filled 2017-05-25 (×4): qty 16
  Filled 2017-05-25 (×2): qty 10
  Filled 2017-05-25 (×3): qty 16
  Filled 2017-05-25: qty 2
  Filled 2017-05-25: qty 16
  Filled 2017-05-25: qty 4

## 2017-05-25 MED ORDER — HYDRALAZINE HCL 50 MG PO TABS
100.0000 mg | ORAL_TABLET | Freq: Three times a day (TID) | ORAL | Status: DC
  Administered 2017-05-25 – 2017-05-30 (×14): 100 mg via ORAL
  Filled 2017-05-25 (×14): qty 2

## 2017-05-25 MED ORDER — ORAL CARE MOUTH RINSE
15.0000 mL | Freq: Two times a day (BID) | OROMUCOSAL | Status: DC
  Administered 2017-05-25 – 2017-05-29 (×5): 15 mL via OROMUCOSAL

## 2017-05-25 MED ORDER — CARVEDILOL 12.5 MG PO TABS
12.5000 mg | ORAL_TABLET | Freq: Two times a day (BID) | ORAL | Status: DC
  Administered 2017-05-25 – 2017-05-30 (×10): 12.5 mg via ORAL
  Filled 2017-05-25 (×10): qty 1

## 2017-05-25 MED ORDER — ISOSORBIDE MONONITRATE ER 60 MG PO TB24
90.0000 mg | ORAL_TABLET | Freq: Every day | ORAL | Status: DC
  Administered 2017-05-25 – 2017-05-30 (×6): 90 mg via ORAL
  Filled 2017-05-25 (×6): qty 1

## 2017-05-25 NOTE — Consult Note (Addendum)
Renal Service Consult Note [facility]  Nicholas BushyLuis Gutierrez 05/25/2017 Nicholas Krabbeobert D Gutierrez [place] Requesting Physician:  Dr Nicholas Gutierrez  Reason for Consult:  Renal failure HPI: The patient is a 67 y.o. year-old w hx of DM, CKD and CHF who presented to ED with SOB, fatigue and leg swelling.  Pt was dc'd several days ago after admission for new onset CHF, HTN'sive crisis, renal failure w/ hypoalbuminemia and proteinuria.  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 US showed 11 cm kidneys w/o hydro.  Seen by [facility] and plans were to place a permanent access in the OP setting after dc.  Pt was dc'd on 05/17/17 w creat of 4.9.  Now admitted with creat 5.58 and vol overload w fatigue as above.    Patient reports significant problems with fatigue, appetite and nausea.  Also SOB and orthopnea/ DOE, leg swelling.  No fevers. No prod cough.   ROS  denies CP  no joint pain   no HA  no blurry vision  no rash  no dysuria  no difficulty voiding  no change in urine color    Past Medical History  Past Medical History:  Diagnosis Date  . CHF (congestive heart failure) (HCC)   . Chronic kidney disease   . Diabetes (HCC)   . Dyspnea    Past Surgical History  Past Surgical History:  Procedure Laterality Date  . NO PAST SURGERIES     Family History History reviewed. No pertinent family history. Social History  reports that  has never smoked. he has never used smokeless tobacco. He reports that he does not drink alcohol or use drugs. Allergies No Known Allergies Home medications Prior to Admission medications   Medication Sig Start Date End Date Taking? Authorizing Provider  carvedilol (COREG) 12.5 MG tablet Take 1 tablet (12.5 mg total) by mouth 2 (two) times daily with a meal. 05/17/17  Yes Alwyn RenMathews, Nicholas G, MD  furosemide (LASIX) 40 MG tablet Take 1 tablet (40 mg total) by mouth daily. 05/18/17  Yes Alwyn RenMathews, Nicholas G, MD  hydrALAZINE  (APRESOLINE) 100 MG tablet Take 1 tablet (100 mg total) by mouth every 8 (eight) hours. 05/17/17  Yes Alwyn RenMathews, Nicholas G, MD  insulin aspart protamine- aspart (NOVOLOG MIX 70/30) (70-30) 100 UNIT/ML injection Inject 0.12 mLs (12 Units total) into the skin 2 (two) times daily with a meal. 05/17/17  Yes Alwyn RenMathews, Nicholas G, MD  isosorbide mononitrate (IMDUR) 30 MG 24 hr tablet Take 3 tablets (90 mg total) by mouth daily. 05/18/17  Yes Alwyn RenMathews, Nicholas G, MD  polyethylene glycol [facility](MIRALAX / Ethelene HalGLYCOLAX) packet Take 17 Gutierrez by mouth daily. 05/18/17  Yes Alwyn RenMathews, Nicholas G, MD  Vitamin D, Ergocalciferol, (DRISDOL) 50000 units CAPS capsule Take 1 capsule (50,000 Units total) by mouth every 7 (seven) days. 05/23/17  Yes Alwyn RenMathews, Nicholas G, MD   Liver Function Tests Recent Labs  Lab 05/24/17 1605  AST 18  ALT 16*  ALKPHOS 105  BILITOT 0.7  PROT 6.1*  ALBUMIN 2.4*   Recent Labs  Lab 05/24/17 1605  LIPASE 27   CBC Recent Labs  Lab 05/24/17 1154 05/25/17 0839  WBC 6.9 6.0  HGB 11.7* 12.3*  HCT 35.7* 39.3  MCV 96.5 98.0  PLT 204 240   Basic Metabolic Panel Recent Labs  Lab 05/24/17 1154 05/25/17 0839  NA 136 138  K 5.3* 5.3*  CL 107 108  CO2 18* 19*  GLUCOSE 157* 122*  BUN 90* 86*  CREATININE 5.58* 5.35*  CALCIUM 8.1* 8.4*   Iron/TIBC/Ferritin/ %Sat No results found for: IRON, TIBC, FERRITIN, IRONPCTSAT  Vitals:   05/25/17 0011 05/25/17 0508 05/25/17 0855 05/25/17 1208  BP: (!) 154/80 (!) 149/79 (!) 160/99 (!) 161/88  Pulse: 65 63 62 64  Resp: (!) 23 17 18 16   Temp: (!) 97.5 F (36.4 C) 97.6 F (36.4 C) 98.1 F (36.7 C) 97.6 F (36.4 C)  TempSrc: Oral Oral Oral Oral  SpO2: 95% 92% 96% 95%  Weight:  104.3 kg (229 lb 14.4 oz)    Height:       Exam Gen slightly ^'d WOB, no distress No rash, cyanosis or gangrene Sclera anicteric, throat clear  +JVD Chest bilat rales 1/3 up RRR no MRG Abd soft ntnd no mass or ascites +bs GU normal male MS no joint effusions or  deformity Ext 2+ bilat LE edema / no wounds or ulcers Neuro is alert, Ox 3 , nf, no asterixis   Home meds: -coreg 12.5 bid/ lasix 40 qd/ hydralazine 100 tid/ imdur 90 qd -70/30 insulin bid -vit D/ miralax  Na 138  K 5.3  BUN 86  Cr 5.35   Glu 122  Alb 2.4 LFT's ok WBC 6k  Hb 12  ptl 240 CXR 1/13 > worsening of bilat CHF, R > L ECHO today showed LVEF 55-60%, ^'d filling pressures   Impression: 1. CKD stage V - pt w/ biopsy proven chronic disease last admission (DM/ collapsing FSG/ HTN) and now early uremia w/ sig vol overload.  Will start high dose IV lasix and po zaroxolyn and will need dialysis this admission. Will call  [facility] in am for [facility] and perm access consult. Have explained to pt and family who are in agreement.  They have a daughter on HD in [place].  2. DM2 - on insulin 3. HTN - worse due to vol overload, cont coreg/ hydral and adding diuretics 4. Pulm edema - CT findings are also edema, will dc IV abx.   Plan - as above  Vinson Moselle MD [employer] pager [phone]   05/25/2017, 4:10 PM

## 2017-05-25 NOTE — Progress Notes (Signed)
  Echocardiogram 2D Echocardiogram has been performed.  Pieter PartridgeBrooke S Eufemia Prindle 05/25/2017, 12:59 PM

## 2017-05-25 NOTE — Progress Notes (Signed)
PROGRESS NOTE    Nicholas Gutierrez  WUJ:[id number] DOB: Aug 16, 1950 DOA: 05/24/2017 PCP: Patient, No Pcp Per     Brief Narrative:  Nicholas Gutierrez is a 67 y.o.  Spanish speaking male with medical history significant for DM, CKD stage 5, glomerulosclerosis, long admission from 12/26 to [facility], then transferred to [facility] on 05/11/2017 till 05/17/2016 with acute renal failure.  At the time, he had been diagnosed with hypertensive urgency, type 2 diabetes, and nephropathy, he also was noted to have an adrenal mass, acute renal arterial thrombosis, nephrotic syndrome with hypoalbuminemia, chronic glomerulonephritis, and as mentioned CKD.  He was supposed to have a nephrologist evaluation on 06/03/2017 to discuss HD cath insertion. He now presents to the ED with progressive shortness of breath since his discharge, stating that he continues to have low amount of urine despite lasix.  He also complains of worsening lower extremity edema, which does not allow him to ambulate properly, stating the legs are too heavy.  In addition, he cannot ambulate more than from bed to the bathroom without becoming very short of breath.  He reports orthopnea as well.  He is sleeping with more than 2-3 pillows. CT chest revealed bilateral pleural effusions. Patient was admitted for further evaluation and treatment.   Assessment & Plan:   Active Problems:   Type 2 diabetes with nephropathy (HCC)   Acute diastolic CHF (congestive heart failure) (HCC)   BPH with urinary obstruction   Adrenal mass (HCC)   Chronic kidney disease (CKD), stage V (HCC)   Healthcare-associated pneumonia   Acute respiratory failure (HCC)   Acute exacerbation of CHF (congestive heart failure) (HCC)   Volume overload due to acute on chronic diastolic heart failure as well as nephrotic syndrome/CKD stage V -BNP 1253 on admission  -Previously seen by Nephrology. He completed vein mapping last admission and now awaiting vascular surgery follow-up  for graft placement evaluation -Consulted nephrology  -Continue Lasix  HCAP -CT chest: Patchy consolidations throughout both lungs suspicious for pneumonia -Continue Maxipime. Stop vanco as MRSA PCR negative   Hypertension  -Continue coreg, imdur, hydralazine    Type II Diabetes  -HA1C 6.2 -SSI  Incidental adrenal mass -Follow up outpatient   DVT prophylaxis: subq hep Code Status: full Family Communication: at bedside Disposition Plan: pending improvement   Consultants:   Nephrology  Procedures:   None   Antimicrobials:  Anti-infectives (From admission, onward)   Start     Dose/Rate Route Frequency Ordered Stop   05/25/17 1630  ceFEPIme (MAXIPIME) 500 mg in dextrose 5 % 50 mL IVPB     500 mg 100 mL/hr over 30 Minutes Intravenous Every 24 hours 05/24/17 1730     05/24/17 1600  vancomycin (VANCOCIN) 2,000 mg in sodium chloride 0.9 % 500 mL IVPB     2,000 mg 250 mL/hr over 120 Minutes Intravenous  Once 05/24/17 1551 05/24/17 1958   05/24/17 1545  ceFEPIme (MAXIPIME) 1 g in dextrose 5 % 50 mL IVPB     1 g 100 mL/hr over 30 Minutes Intravenous  Once 05/24/17 1536 05/24/17 1713       Subjective: Patient seen with family at bedside, using [employer] interpreter. Patient complains of fluid overload, shortness of breath.  Objective: Vitals:   05/25/17 0011 05/25/17 0508 05/25/17 0855 05/25/17 1208  BP: (!) 154/80 (!) 149/79 (!) 160/99 (!) 161/88  Pulse: 65 63 62 64  Resp: (!) 23 17 18 16   Temp: (!) 97.5 F (36.4 C) 97.6 F (36.4  C) 98.1 F (36.7 C) 97.6 F (36.4 C)  TempSrc: Oral Oral Oral Oral  SpO2: 95% 92% 96% 95%  Weight:  104.3 kg (229 lb 14.4 oz)    Height:        Intake/Output Summary (Last 24 hours) at 05/25/2017 1303 Last data filed at 05/25/2017 0815 Gross per 24 hour  Intake 170 ml  Output 1620 ml  Net -1450 ml   Filed Weights   05/24/17 2104 05/25/17 0508  Weight: 104.8 kg (231 lb) 104.3 kg (229 lb 14.4 oz)    Examination:  General exam:  Appears calm and comfortable  Respiratory system: Diminished breath sounds bilateral bases  Cardiovascular system: S1 & S2 heard, RRR. No JVD, murmurs, rubs, gallops or clicks. +2 pedal edema. Gastrointestinal system: Abdomen is soft and nontender. No organomegaly or masses felt. Normal bowel sounds heard. Central nervous system: Alert and oriented. No focal neurological deficits. Extremities: Symmetric Skin: No rashes, lesions or ulcers Psychiatry: Judgement and insight appear normal. Mood & affect appropriate.   Data Reviewed: I have personally reviewed following labs and imaging studies  CBC: Recent Labs  Lab 05/24/17 1154 05/25/17 0839  WBC 6.9 6.0  HGB 11.7* 12.3*  HCT 35.7* 39.3  MCV 96.5 98.0  PLT 204 240   Basic Metabolic Panel: Recent Labs  Lab 05/24/17 1154 05/25/17 0839  NA 136 138  K 5.3* 5.3*  CL 107 108  CO2 18* 19*  GLUCOSE 157* 122*  BUN 90* 86*  CREATININE 5.58* 5.35*  CALCIUM 8.1* 8.4*   GFR: Estimated Creatinine Clearance: 14.7 mL/min (A) (by C-G formula based on SCr of 5.35 mg/dL (H)). Liver Function Tests: Recent Labs  Lab 05/24/17 1605  AST 18  ALT 16*  ALKPHOS 105  BILITOT 0.7  PROT 6.1*  ALBUMIN 2.4*   Recent Labs  Lab 05/24/17 1605  LIPASE 27   No results for input(s): AMMONIA in the last 168 hours. Coagulation Profile: No results for input(s): INR, PROTIME in the last 168 hours. Cardiac Enzymes: No results for input(s): CKTOTAL, CKMB, CKMBINDEX, TROPONINI in the last 168 hours. BNP (last 3 results) No results for input(s): PROBNP in the last 8760 hours. HbA1C: Recent Labs    05/24/17 1736  HGBA1C 6.2*   CBG: Recent Labs  Lab 05/24/17 2146 05/25/17 0011 05/25/17 0516 05/25/17 0840 05/25/17 1205  GLUCAP 99 119* 96 117* 131*   Lipid Profile: No results for input(s): CHOL, HDL, LDLCALC, TRIG, CHOLHDL, LDLDIRECT in the last 72 hours. Thyroid Function Tests: Recent Labs    05/24/17 1736  TSH 3.877   Anemia  Panel: No results for input(s): VITAMINB12, FOLATE, FERRITIN, TIBC, IRON, RETICCTPCT in the last 72 hours. Sepsis Labs: No results for input(s): PROCALCITON, LATICACIDVEN in the last 168 hours.  Recent Results (from the past 240 hour(s))  Urine culture     Status: None   Collection Time: 05/24/17  3:33 PM  Result Value Ref Range Status   Specimen Description URINE, CLEAN CATCH  Final   Special Requests NONE  Final   Culture NO GROWTH  Final   Report Status 05/25/2017 FINAL  Final  MRSA PCR Screening     Status: None   Collection Time: 05/24/17  9:56 PM  Result Value Ref Range Status   MRSA by PCR NEGATIVE NEGATIVE Final    Comment:        The GeneXpert MRSA Assay (FDA approved for NASAL specimens only), is one component of a comprehensive MRSA colonization surveillance program. It  is not intended to diagnose MRSA infection nor to guide or monitor treatment for MRSA infections.        Radiology Studies: Ct Abdomen Pelvis Wo Contrast  Result Date: 05/24/2017 CLINICAL DATA:  Patient has a history of congestive heart failure. Increasing abdominal distension in the last few days. EXAM: CT CHEST AND ABDOMEN WITHOUT CONTRAST TECHNIQUE: Multidetector CT imaging of the chest and abdomen was performed following the standard protocol without intravenous contrast. COMPARISON:  None. FINDINGS: CT CHEST FINDINGS WITHOUT CONTRAST Cardiovascular: Atherosclerosis of the aorta is identified. The heart size is enlarged. There is no pericardial effusion. Mediastinum/Nodes: There are mild prominent mediastinal and hilar lymph nodes largest measuring 1 cm. These may be reactive lymph nodes. The trachea is normal. The esophagus is unremarkable. The thyroid glands are normal. Lungs/Pleura: There are large bilateral pleural effusions. There are patchy consolidations most prominently involving the bilateral lower lobes but also to a lesser degree involves the right upper lobe and right middle lobe.  Musculoskeletal: No acute abnormality. CT ABDOMEN FINDINGS WITHOUT CONTRAST Hepatobiliary: Left lobe liver is enlarged with mild nodular contour of liver suggesting cirrhosis of liver. No focal liver lesion is identified. The gallbladder is normal. Minimal ascites is noted surrounding gallbladder. The biliary tree is normal. Pancreas: Unremarkable. No pancreatic ductal dilatation or surrounding inflammatory changes. Spleen: Normal in size without focal abnormality. Adrenals/Urinary Tract: Adrenal glands are unremarkable. Kidneys are normal, without renal calculi, focal lesion, or hydronephrosis. Minimal bilateral perinephric fluid is identified. Bladder is unremarkable. Stomach/Bowel: Stomach is within normal limits. Appendix appears normal. No evidence of bowel wall thickening, distention, or inflammatory changes. Vascular/Lymphatic: Aortic atherosclerosis. No enlarged abdominal or pelvic lymph nodes. Other: Minimal ascites is identified in the abdomen. Musculoskeletal: No acute abnormality is noted. IMPRESSION: Large bilateral pleural effusions. Patchy consolidations throughout both lungs suspicious for pneumonia. Minimal ascites in the abdomen. No acute abnormality identified in the abdomen and pelvis. Electronically Signed   By: Sherian ReinWei-Chen  Lin M.D.   On: 05/24/2017 15:04   Dg Chest 2 View  Result Date: 05/25/2017 CLINICAL DATA:  CHF, fluid overload, pneumonia EXAM: CHEST  2 VIEW COMPARISON:  05/24/2017 FINDINGS: Cardiomegaly with vascular congestion. Interstitial prominence in the lungs may reflect interstitial edema. Small bilateral effusions and bibasilar atelectasis. IMPRESSION: Mild interstitial edema. Small effusions with bibasilar atelectasis. Electronically Signed   By: Charlett NoseKevin  Dover M.D.   On: 05/25/2017 08:13   Dg Chest 2 View  Result Date: 05/24/2017 CLINICAL DATA:  Shortness of breath, chest pain and history of CHF. EXAM: CHEST  2 VIEW COMPARISON:  12/01/2006 FINDINGS: The heart size and  mediastinal contours are within normal limits. There is evidence mild to moderate pulmonary edema and probable small bilateral pleural effusions. Atelectasis present at both lung bases. The visualized skeletal structures are unremarkable. IMPRESSION: Mild to moderate pulmonary edema. Electronically Signed   By: Irish LackGlenn  Yamagata M.D.   On: 05/24/2017 12:41   Ct Chest Wo Contrast  Result Date: 05/24/2017 CLINICAL DATA:  Patient has a history of congestive heart failure. Increasing abdominal distension in the last few days. EXAM: CT CHEST AND ABDOMEN WITHOUT CONTRAST TECHNIQUE: Multidetector CT imaging of the chest and abdomen was performed following the standard protocol without intravenous contrast. COMPARISON:  None. FINDINGS: CT CHEST FINDINGS WITHOUT CONTRAST Cardiovascular: Atherosclerosis of the aorta is identified. The heart size is enlarged. There is no pericardial effusion. Mediastinum/Nodes: There are mild prominent mediastinal and hilar lymph nodes largest measuring 1 cm. These may be reactive lymph nodes. The  trachea is normal. The esophagus is unremarkable. The thyroid glands are normal. Lungs/Pleura: There are large bilateral pleural effusions. There are patchy consolidations most prominently involving the bilateral lower lobes but also to a lesser degree involves the right upper lobe and right middle lobe. Musculoskeletal: No acute abnormality. CT ABDOMEN FINDINGS WITHOUT CONTRAST Hepatobiliary: Left lobe liver is enlarged with mild nodular contour of liver suggesting cirrhosis of liver. No focal liver lesion is identified. The gallbladder is normal. Minimal ascites is noted surrounding gallbladder. The biliary tree is normal. Pancreas: Unremarkable. No pancreatic ductal dilatation or surrounding inflammatory changes. Spleen: Normal in size without focal abnormality. Adrenals/Urinary Tract: Adrenal glands are unremarkable. Kidneys are normal, without renal calculi, focal lesion, or hydronephrosis.  Minimal bilateral perinephric fluid is identified. Bladder is unremarkable. Stomach/Bowel: Stomach is within normal limits. Appendix appears normal. No evidence of bowel wall thickening, distention, or inflammatory changes. Vascular/Lymphatic: Aortic atherosclerosis. No enlarged abdominal or pelvic lymph nodes. Other: Minimal ascites is identified in the abdomen. Musculoskeletal: No acute abnormality is noted. IMPRESSION: Large bilateral pleural effusions. Patchy consolidations throughout both lungs suspicious for pneumonia. Minimal ascites in the abdomen. No acute abnormality identified in the abdomen and pelvis. Electronically Signed   By: Sherian Rein M.D.   On: 05/24/2017 15:04      Scheduled Meds: . carvedilol  12.5 mg Oral BID WC  . ceFEPime (MAXIPIME) IV  500 mg Intravenous Q24H  . furosemide  80 mg Intravenous Daily  . heparin  5,000 Units Subcutaneous Q8H  . hydrALAZINE  100 mg Oral Q8H  . insulin aspart  0-9 Units Subcutaneous TID WC  . isosorbide mononitrate  90 mg Oral Daily  . mouth rinse  15 mL Mouth Rinse BID   Continuous Infusions:   LOS: 1 day    Time spent: 40 minutes   Noralee Stain, DO [facility] [email or web address] Password [email or web address] 05/25/2017, 1:03 PM

## 2017-05-25 NOTE — Evaluation (Signed)
Physical Therapy Evaluation Patient Details Name: Nicholas Gutierrez MRN: [id number] DOB: 21-Oct-1950 Today's Date: 05/25/2017   History of Present Illness  Pt is a 67 y.o. male with recent medical history significant for DM, CKD stage 5,and history of glomerulosclerosis. He had a long admission from 12/26 to [facility], then [facility] on 05/[facility]/2017 till 05/17/2016 with acute renal failure.  At the time, he had been diagnosed with hypertensive urgency, type 2 diabetes, and nephropathy, he also was noted to have an adrenal mass, acute renal arterial thrombosis, nephrotic syndrome with hypoalbuminemia, chronic glomerulonephritis, and as mentioned CKD.  He was supposed to have a nephrologist evaluation on 06/03/2017 to discuss HD cath insertion  He presented to the ED with progressive shortness of breath since his discharge.     Clinical Impression  Pt admitted with above diagnosis. Pt currently with functional limitations due to the deficits listed below (see PT Problem List). On eval, pt demo modified independence with bed mobility. Min guard assist needed for transfers and ambulation 35 feet. Gait distance limited by weakness and SOB. Pt ambulated on RA with desat to 91%. Pt also demonstrated strength and balance deficits. Pt will benefit from skilled PT to increase their independence and safety with mobility to allow discharge to the venue listed below.       Follow Up Recommendations Home health PT    Equipment Recommendations  None recommended by PT    Recommendations for Other Services       Precautions / Restrictions Precautions Precautions: Other (comment) Precaution Comments: watch sats      Mobility  Bed Mobility Overal bed mobility: Modified Independent                Transfers Overall transfer level: Needs assistance Equipment used: None Transfers: Sit to/from Stand;Stand Pivot Transfers Sit to Stand: Min guard Stand pivot transfers: Min guard           Ambulation/Gait Ambulation/Gait assistance: Min guard Ambulation Distance (Feet): 35 Feet Assistive device: None Gait Pattern/deviations: Step-through pattern;Decreased stride length;Wide base of support Gait velocity: decreased Gait velocity interpretation: Below normal speed for age/gender General Gait Details: Pt ambulated on RA with desat to 91%. 2 L O2 replaced in recliner wit SpO2 97%.  Stairs            Wheelchair Mobility    Modified Rankin (Stroke Patients Only)       Balance Overall balance assessment: Needs assistance Sitting-balance support: No upper extremity supported;Feet supported Sitting balance-Leahy Scale: Good     Standing balance support: No upper extremity supported;During functional activity Standing balance-Leahy Scale: Fair                               Pertinent Vitals/Pain Pain Assessment: No/denies pain    Home Living Family/patient expects to be discharged to:: Private residence Living Arrangements: Spouse/significant other;Children Available Help at Discharge: Family;Available 24 hours/day Type of Home: Mobile home Home Access: Stairs to enter Entrance Stairs-Rails: Doctor, general practiceight;Left Entrance Stairs-Number of Steps: 5 Home Layout: One level Home Equipment: None      Prior Function Level of Independence: Independent               Hand Dominance        Extremity/Trunk Assessment   Upper Extremity Assessment Upper Extremity Assessment: Overall WFL for tasks assessed    Lower Extremity Assessment Lower Extremity Assessment: Generalized weakness    Cervical / Trunk  Assessment Cervical / Trunk Assessment: Normal  Communication   Communication: Prefers language other than [place];Interpreter utilized  Cognition Arousal/Alertness: Awake/alert Behavior During Therapy: WFL for tasks assessed/performed Overall Cognitive Status: Within Functional Limits for tasks assessed                                         General Comments      Exercises     Assessment/Plan    PT Assessment Patient needs continued PT services  PT Problem List Decreased strength;Decreased mobility;Decreased activity tolerance;Cardiopulmonary status limiting activity;Decreased balance       PT Treatment Interventions DME instruction;Therapeutic activities;Gait training;Therapeutic exercise;Patient/family education;Stair training;Balance training;Functional mobility training    PT Goals (Current goals can be found in the Care Plan section)  Acute Rehab PT Goals Patient Stated Goal: breathe better PT Goal Formulation: With patient/family Time For Goal Achievement: 06/08/17 Potential to Achieve Goals: Good    Frequency Min 3X/week   Barriers to discharge        Co-evaluation               AM-PAC PT "6 Clicks" Daily Activity  Outcome Measure Difficulty turning over in bed (including adjusting bedclothes, sheets and blankets)?: None Difficulty moving from lying on back to sitting on the side of the bed? : A Little Difficulty sitting down on and standing up from a chair with arms (e.g., wheelchair, bedside commode, etc,.)?: A Little Help needed moving to and from a bed to chair (including a wheelchair)?: None Help needed walking in hospital room?: A Little Help needed climbing 3-5 steps with a railing? : A Little 6 Click Score: 20    End of Session Equipment Utilized During Treatment: Gait belt;Oxygen Activity Tolerance: Patient limited by fatigue Patient left: in chair;with call bell/phone within reach;with family/visitor present Nurse Communication: Mobility status PT Visit Diagnosis: Unsteadiness on feet (R26.81);Difficulty in walking, not elsewhere classified (R26.2);Muscle weakness (generalized) (M62.81)    Time: 9147-8295 PT Time Calculation (min) (ACUTE ONLY): 22 min   Charges:   PT Evaluation $PT Eval Low Complexity: 1 Low     PT G Codes:        Aida Raider, PT   Office # [phone] Pager [phone]   Ilda Foil 05/25/2017, 9:47 AM

## 2017-05-26 DIAGNOSIS — E119 Type 2 diabetes mellitus without complications: Secondary | ICD-10-CM

## 2017-05-26 DIAGNOSIS — N185 Chronic kidney disease, stage 5: Secondary | ICD-10-CM

## 2017-05-26 DIAGNOSIS — I509 Heart failure, unspecified: Secondary | ICD-10-CM

## 2017-05-26 LAB — HEPATITIS B CORE ANTIBODY, IGM: HEP B C IGM: NEGATIVE

## 2017-05-26 LAB — CBC
HCT: 36.3 % — ABNORMAL LOW (ref 39.0–52.0)
Hemoglobin: 11.5 g/dL — ABNORMAL LOW (ref 13.0–17.0)
MCH: 30.8 pg (ref 26.0–34.0)
MCHC: 31.7 g/dL (ref 30.0–36.0)
MCV: 97.3 fL (ref 78.0–100.0)
PLATELETS: 216 10*3/uL (ref 150–400)
RBC: 3.73 MIL/uL — AB (ref 4.22–5.81)
RDW: 13.5 % (ref 11.5–15.5)
WBC: 6.4 10*3/uL (ref 4.0–10.5)

## 2017-05-26 LAB — BASIC METABOLIC PANEL
Anion gap: 11 (ref 5–15)
Anion gap: 11 (ref 5–15)
BUN: 87 mg/dL — ABNORMAL HIGH (ref 6–20)
BUN: 87 mg/dL — AB (ref 6–20)
CALCIUM: 8.3 mg/dL — AB (ref 8.9–10.3)
CHLORIDE: 107 mmol/L (ref 101–111)
CHLORIDE: 105 mmol/L (ref 101–111)
CO2: 21 mmol/L — AB (ref 22–32)
CO2: 21 mmol/L — ABNORMAL LOW (ref 22–32)
CREATININE: 5.54 mg/dL — AB (ref 0.61–1.24)
Calcium: 8 mg/dL — ABNORMAL LOW (ref 8.9–10.3)
Creatinine, Ser: 5.68 mg/dL — ABNORMAL HIGH (ref 0.61–1.24)
GFR calc Af Amer: 11 mL/min — ABNORMAL LOW (ref >60)
GFR calc non Af Amer: 10 mL/min — ABNORMAL LOW (ref >60)
GFR calc non Af Amer: 9 mL/min — ABNORMAL LOW (ref >60)
GFR, EST AFRICAN AMERICAN: 11 mL/min — AB (ref >60)
GLUCOSE: 107 mg/dL — AB (ref 65–99)
GLUCOSE: 167 mg/dL — AB (ref 65–99)
POTASSIUM: 4.6 mmol/L (ref 3.5–5.1)
Potassium: 4.7 mmol/L (ref 3.5–5.1)
Sodium: 139 mmol/L (ref 135–145)
Sodium: 137 mmol/L (ref 135–145)

## 2017-05-26 LAB — HEPATITIS B SURFACE ANTIBODY,QUALITATIVE: Hep B S Ab: NONREACTIVE

## 2017-05-26 LAB — HEPATITIS B SURFACE ANTIGEN: Hepatitis B Surface Ag: NEGATIVE

## 2017-05-26 LAB — GLUCOSE, CAPILLARY
GLUCOSE-CAPILLARY: 108 mg/dL — AB (ref 65–99)
Glucose-Capillary: 135 mg/dL — ABNORMAL HIGH (ref 65–99)
Glucose-Capillary: 165 mg/dL — ABNORMAL HIGH (ref 65–99)
Glucose-Capillary: 149 mg/dL — ABNORMAL HIGH (ref 65–99)
Glucose-Capillary: 161 mg/dL — ABNORMAL HIGH (ref 65–99)

## 2017-05-26 LAB — TROPONIN I: Troponin I: <0.03 ng/mL (ref <0.03)

## 2017-05-26 MED ORDER — HEPARIN SODIUM (PORCINE) 1000 UNIT/ML DIALYSIS: 1000.0000 [IU] | INTRAMUSCULAR | Status: DC | PRN

## 2017-05-26 MED ORDER — LIDOCAINE HCL (PF) 1 % IJ SOLN: 5.0000 mL | INTRAMUSCULAR | Status: DC | PRN

## 2017-05-26 MED ORDER — PENTAFLUOROPROP-TETRAFLUOROETH EX AERO: 1.0000 "application " | INHALATION_SPRAY | CUTANEOUS | Status: DC | PRN

## 2017-05-26 MED ORDER — LIDOCAINE-PRILOCAINE 2.5-2.5 % EX CREA: 1.0000 "application " | TOPICAL_CREAM | CUTANEOUS | Status: DC | PRN

## 2017-05-26 MED ORDER — SODIUM CHLORIDE 0.9 % IV SOLN: 100.0000 mL | INTRAVENOUS | Status: DC | PRN

## 2017-05-26 MED ORDER — METOPROLOL TARTRATE 5 MG/5ML IV SOLN
5.0000 mg | Freq: Once | INTRAVENOUS | Status: AC
  Administered 2017-05-26: 5 mg via INTRAVENOUS
  Filled 2017-05-26: qty 5

## 2017-05-26 MED ORDER — SODIUM CHLORIDE 0.9 % IV SOLN
100.0000 mL | INTRAVENOUS | Status: DC | PRN
  Administered 2017-05-27: 11:00:00 via INTRAVENOUS

## 2017-05-26 MED ORDER — ALTEPLASE 2 MG IJ SOLR: 2.0000 mg | Freq: Once | INTRAMUSCULAR | Status: DC | PRN

## 2017-05-26 NOTE — Progress Notes (Signed)
  [facility] Progress Note   Assessment/ Plan:   1. 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  Good diuresis with IV Lasix but needs to start dialysis.  Contacting [facility].    2. DM2 - on insulin 3. HTN - worse due to vol overload, cont coreg/ hydral and adding diuretics 4. Pulm edema - off abx    Subjective:    Still having breathing issues.  Dialysis and TDC/ access placement explained to pt and wife with assistance of video interpreter.  All questions answered.   Objective:   BP 119/67   Pulse 77   Temp 98.8 F (37.1 C) (Oral)   Resp 15   Ht 5\' 2"  (1.575 m)   Wt 102.2 kg (225 lb 6.4 oz)   SpO2 95%   BMI 41.23 kg/m   Physical Exam: Gen: older gentleman, NAD CVS:RRR with+ S3 Resp: wearing O2, bilateral crackles Abd: mildly distended Ext:+2 LE edema  Labs: BMET Recent Labs  Lab 05/24/17 1154 05/25/17 0839 05/26/17 0658  NA 136 138 139  K 5.3* 5.3* 4.7  CL 107 108 107  CO2 18* 19* 21*  GLUCOSE 157* 122* 107*  BUN 90* 86* 87*  CREATININE 5.58* 5.35* 5.54*  CALCIUM 8.1* 8.4* 8.3*   CBC Recent Labs  Lab 05/24/17 1154 05/25/17 0839 05/26/17 0658  WBC 6.9 6.0 6.4  HGB 11.7* 12.3* 11.5*  HCT 35.7* 39.3 36.3*  MCV 96.5 98.0 97.3  PLT 204 240 216    @IMGRELPRIORS @ Medications:    . carvedilol  12.5 mg Oral BID WC  . heparin  5,000 Units Subcutaneous Q8H  . hydrALAZINE  100 mg Oral Q8H  . insulin aspart  0-9 Units Subcutaneous TID WC  . isosorbide mononitrate  90 mg Oral Daily  . mouth rinse  15 mL Mouth Rinse BID  . metolazone  5 mg Oral BID     Bufford ButtnerElizabeth Camaya Gannett, MD [facility] : [facility] pgr [phone] 05/26/2017, 9:32 AM

## 2017-05-26 NOTE — H&P (View-Only) (Signed)
Hospital Consult    Reason for Consult: Need for dialysis access  Requesting Physician:   Dr. Signe ColtUpton MRN #:  [id number]  History of Present Illness: This is a 67 y.o. male with history of diabetes mellitus, CHF, and CKD stage V with recent renal failure.  He is seen in consultation this morning with need for dialysis access placement.  Patient was seen during last admission plan was to schedule dialysis access placement as an outpatient.  Video interpreter was used during exam.  Patient denies SOB.  He is R arm dominant and would like to proceed with dialysis access placement in L arm.    Past Medical History:  Diagnosis Date  . CHF (congestive heart failure) (HCC)   . Chronic kidney disease   . Diabetes (HCC)   . Dyspnea     Past Surgical History:  Procedure Laterality Date  . NO PAST SURGERIES      No Known Allergies  Prior to Admission medications   Medication Sig Start Date End Date Taking? Authorizing Provider  carvedilol (COREG) 12.5 MG tablet Take 1 tablet (12.5 mg total) by mouth 2 (two) times daily with a meal. 05/17/17  Yes Alwyn RenMathews, Elizabeth G, MD  furosemide (LASIX) 40 MG tablet Take 1 tablet (40 mg total) by mouth daily. 05/18/17  Yes Alwyn RenMathews, Elizabeth G, MD  hydrALAZINE (APRESOLINE) 100 MG tablet Take 1 tablet (100 mg total) by mouth every 8 (eight) hours. 05/17/17  Yes Alwyn RenMathews, Elizabeth G, MD  insulin aspart protamine- aspart (NOVOLOG MIX 70/30) (70-30) 100 UNIT/ML injection Inject 0.12 mLs (12 Units total) into the skin 2 (two) times daily with a meal. 05/17/17  Yes Alwyn RenMathews, Elizabeth G, MD  isosorbide mononitrate (IMDUR) 30 MG 24 hr tablet Take 3 tablets (90 mg total) by mouth daily. 05/18/17  Yes Alwyn RenMathews, Elizabeth G, MD  polyethylene glycol [facility](MIRALAX / Ethelene HalGLYCOLAX) packet Take 17 g by mouth daily. 05/18/17  Yes Alwyn RenMathews, Elizabeth G, MD  Vitamin D, Ergocalciferol, (DRISDOL) 50000 units CAPS capsule Take 1 capsule (50,000 Units total) by mouth every 7 (seven) days. 05/23/17  Yes  Alwyn RenMathews, Elizabeth G, MD    Social History   Socioeconomic History  . Marital status: Married    Spouse name: Not on file  . Number of children: Not on file  . Years of education: Not on file  . Highest education level: Not on file  Social Needs  . Financial resource strain: Not on file  . Food insecurity - worry: Not on file  . Food insecurity - inability: Not on file  . Transportation needs - medical: Not on file  . Transportation needs - non-medical: Not on file  Occupational History  . Occupation: factory  Tobacco Use  . Smoking status: Never Smoker  . Smokeless tobacco: Never Used  Substance and Sexual Activity  . Alcohol use: No    Frequency: Never  . Drug use: No  . Sexual activity: No  Other Topics Concern  . Not on file  Social History Narrative  . Not on file     History reviewed. No pertinent family history.  ROS: Otherwise negative unless mentioned in HPI  Physical Examination  Vitals:   05/26/17 0423 05/26/17 0842  BP: 136/73 119/67  Pulse: 74 77  Resp: 15   Temp: 98.8 F (37.1 C)   SpO2: 95%    Body mass index is 41.23 kg/m.  General:  WDWN in NAD Gait: Not observed HENT: WNL, normocephalic Pulmonary: normal non-labored breathing, without Rales, rhonchi,  wheezing Cardiac: regular Abdomen: soft, NT/ND, no masses Skin: without rashes Vascular Exam/Pulses: symmetrical radial pulses Extremities: without ischemic changes, without Gangrene , without cellulitis; without open wounds;  Musculoskeletal: no muscle wasting or atrophy  Neurologic: A&O X 3;  No focal weakness or paresthesias are detected; speech is fluent/normal Psychiatric:  The pt has Normal affect. Lymph:  Unremarkable  CBC    Component Value Date/Time   WBC 6.4 05/26/2017 0658   RBC 3.73 (L) 05/26/2017 0658   HGB 11.5 (L) 05/26/2017 0658   HCT 36.3 (L) 05/26/2017 0658   PLT 216 05/26/2017 0658   MCV 97.3 05/26/2017 0658   MCH 30.8 05/26/2017 0658   MCHC 31.7 05/26/2017  0658   RDW 13.5 05/26/2017 0658    BMET    Component Value Date/Time   NA 139 05/26/2017 0658   K 4.7 05/26/2017 0658   CL 107 05/26/2017 0658   CO2 21 (L) 05/26/2017 0658   GLUCOSE 107 (H) 05/26/2017 0658   BUN 87 (H) 05/26/2017 0658   CREATININE 5.54 (H) 05/26/2017 0658   CALCIUM 8.3 (L) 05/26/2017 0658   GFRNONAA 10 (L) 05/26/2017 0658   GFRAA 11 (L) 05/26/2017 0658    COAGS: Lab Results  Component Value Date   INR 0.97 05/13/2017     Non-Invasive Vascular Imaging:   UPPER EXTREMITY VEIN MAPPING History: Pre-access.  Examination Guidelines: A complete evaluation includes B-mode imaging, spectral doppler, color doppler, and power doppler as needed of all accessible portions of each vessel. Bilateral testing is considered an integral part of a complete examination. Limited examinations for reoccurring indications may be performed as noted.  +-----------------+-------------+----------+---------+ Right Cephalic  Diameter (mm)Depth (mm)Findings  +-----------------+-------------+----------+---------+ Shoulder       4.90     9.00        +-----------------+-------------+----------+---------+ Prox upper arm    6.40    10.00        +-----------------+-------------+----------+---------+ Mid upper arm    5.60     1.90        +-----------------+-------------+----------+---------+ Dist upper arm    4.90     1.60        +-----------------+-------------+----------+---------+ Antecubital fossa  7.20     1.20        +-----------------+-------------+----------+---------+ Prox forearm     4.60     3.70  branching +-----------------+-------------+----------+---------+ Mid forearm     4.60     3.10        +-----------------+-------------+----------+---------+ Dist forearm                 IV    +-----------------+-------------+----------+---------+  +-----------------+-------------+----------+---------+ Left Cephalic  Diameter (mm)Depth (mm)Findings  +-----------------+-------------+----------+---------+ Shoulder       6.10    10.80        +-----------------+-------------+----------+---------+ Prox upper arm    6.50     4.50  branching +-----------------+-------------+----------+---------+ Mid upper arm    5.50     1.40        +-----------------+-------------+----------+---------+ Dist upper arm    6.90     1.60        +-----------------+-------------+----------+---------+ Antecubital fossa  6.70     3.30  branching +-----------------+-------------+----------+---------+ Prox forearm     4.80     5.70        +-----------------+-------------+----------+---------+ Mid forearm     4.60     4.10        +-----------------+-------------+----------+---------+ Dist forearm     3.90     2.20  branching +-----------------+-------------+----------+---------+   Final  Interpretation      *See table(s) above for measurements and observations.  Waverly Ferrari  Electronically signed by Waverly Ferrari on 05/17/2017 at 4:19:30 PM.  UPPER EXTREMITY DUPLEX STUDY  Indications: Pre HD Access.  Examination Guidelines: A complete evaluation includes B-mode imaging, spectral doppler, color doppler, and power doppler as needed of all accessible portions of each vessel. Bilateral testing is considered an integral part of a complete examination. Limited examinations for reoccurring indications may be performed as noted.   Right Doppler Findings: +----------+----------+---------+ Site   PSV (cm/s)Waveform  +----------+----------+---------+ Subclavian109    triphasic +----------+----------+---------+ Axillary 185           +----------+----------+---------+ Brachial 126    triphasic +----------+----------+---------+ Radial  78    triphasic +----------+----------+---------+ Ulnar   74    triphasic +----------+----------+---------+  Left Doppler Findings: +----------+----------+---------+------+--------+ Site   PSV (cm/s)Waveform PlaqueComments +----------+----------+---------+------+--------+ [id number]    triphasic        +----------+----------+---------+------+--------+ Axillary 130    triphasic        +----------+----------+---------+------+--------+ Brachial 97    triphasic        +----------+----------+---------+------+--------+ Radial  67    triphasic        +----------+----------+---------+------+--------+ Ulnar   72    triphasic        +----------+----------+---------+------+--------+  Final Interpretation:  Right: <50% stenosis visualized in the right upper extremity. Left: <50% stenosis visualized in the left upper extremity. *See table(s) above for measurements and observations.   Electronically signed by Waverly Ferrari on 05/17/2017 at 4:23:38 PM.  Statin:  No. Beta Blocker:  Yes.   Aspirin:  No. ACEI:  No. ARB:  No. CCB use:  No Other antiplatelets/anticoagulants:  No.    ASSESSMENT/PLAN: This is a 67 y.o. male with CKD V now with ARF and will require HD during this admission  Vein conduits appear adequate in L upper arm; likely hx of phlebitis in AC fossa given scarring Restrict L arm Plan is for [facility] catheter placement as well as AV fistula during this admission Risks and benefits of surgery were reviewed with the use of video interpreter and all of the patient's questions were answered Timing of surgery will be discussed with on call surgeon   Emilie Rutter PA-C [facility] [phone]  I have examined the  patient, reviewed and agree with above.  Has distended surface veins in his left arm with probable volume overload.  Discussed with the patient and his wife via video interpreter plan for tunneled catheter and left arm access with Dr. Edilia Bo  tomorrow.  Gretta Began, MD 05/26/2017 6:10 PM

## 2017-05-26 NOTE — Evaluation (Addendum)
Occupational Therapy Evaluation Patient Details Name: Nicholas Gutierrez MRN: [id number] DOB: [birth year] Today's Date: 05/26/2017    History of Present Illness Pt is a 67 y.o. male with recent medical history significant for DM, CKD stage 5,and history of glomerulosclerosis. He had a long admission from 12/26 to [facility], then transferred to [facility] on 05/11/2017 till 05/17/2016 with acute renal failure.  At the time, he had been diagnosed with hypertensive urgency, type 2 diabetes, and nephropathy, he also was noted to have an adrenal mass, acute renal arterial thrombosis, nephrotic syndrome with hypoalbuminemia, chronic glomerulonephritis, and as mentioned CKD.  He was supposed to have a nephrologist evaluation on 06/03/2017 to discuss HD cath insertion  He presented to the ED with progressive shortness of breath since his discharge.    Clinical Impression   PTA, pt was independent with ADL and functional mobility. Over the past three weeks, he has had progressive weakness and has required assistance with tub transfers. Pt currently requiring overall min assist for toilet transfers and LB ADL. He is very motivated to return to work and will have good family support post-acute D/C. Pt presenting with decreased activity tolerance for ADL, generalized LE weakness, and dyspnea on exertion impacting his independence and safety with ADL and functional mobility. He would benefit from continued OT services while admitted to improve independence and safety with ADL and functional mobility post-acute D/C. OT will continue to follow while admitted.     Follow Up Recommendations  Home health OT;Supervision/Assistance - 24 hour    Equipment Recommendations  3 in 1 bedside commode    Recommendations for Other Services       Precautions / Restrictions Precautions Precautions: Other (comment) Precaution Comments: watch sats Restrictions Weight Bearing Restrictions: No      Mobility Bed  Mobility Overal bed mobility: Needs Assistance Bed Mobility: Supine to Sit;Sit to Supine     Supine to sit: Min assist Sit to supine: Supervision   General bed mobility comments: Wife providing min assist for supine to sit.   Transfers Overall transfer level: Needs assistance Equipment used: None Transfers: Sit to/from [employer] Sit to Stand: Min assist         General transfer comment: Min assist for power up and to stabilize.     Balance Overall balance assessment: Needs assistance Sitting-balance support: No upper extremity supported;Feet supported Sitting balance-Leahy Scale: Good     Standing balance support: No upper extremity supported;During functional activity Standing balance-Leahy Scale: Fair Standing balance comment: Min guard-min assist for stabilizing during dynamic tasks.                            ADL either performed or assessed with clinical judgement   ADL Overall ADL's : Needs assistance/impaired Eating/Feeding: Set up;Sitting   Grooming: Set up;Sitting   Upper Body Bathing: Supervision/ safety;Sitting   Lower Body Bathing: Minimal assistance;Sit to/from stand   Upper Body Dressing : Supervision/safety;Sitting   Lower Body Dressing: Minimal assistance;Sit to/from stand   Toilet Transfer: Minimal assistance;Ambulation Toilet Transfer Details (indicate cue type and reason): Taking a few steps at bedside Toileting- Clothing Manipulation and Hygiene: Minimal assistance;Sit to/from stand       Functional mobility during ADLs: Minimal assistance(limited distance) General ADL Comments: Pt's wife very helpful and will be able to assist at home.      Vision Patient Visual Report: No change from baseline Vision Assessment?: No apparent visual deficits  Perception     Praxis      Pertinent Vitals/Pain Pain Assessment: No/denies pain     Hand Dominance     Extremity/Trunk Assessment Upper Extremity  Assessment Upper Extremity Assessment: Overall WFL for tasks assessed   Lower Extremity Assessment Lower Extremity Assessment: Generalized weakness   Cervical / Trunk Assessment Cervical / Trunk Assessment: Normal   Communication Communication Communication: Prefers language other than [place];Interpreter utilized(David; [phone])   Cognition Arousal/Alertness: Awake/alert Behavior During Therapy: WFL for tasks assessed/performed Overall Cognitive Status: Within Functional Limits for tasks assessed                                     General Comments  SpO2>95% on 2L O2 throughout    Exercises     Shoulder Instructions      Home Living Family/patient expects to be discharged to:: Private residence Living Arrangements: Spouse/significant other;Children Available Help at Discharge: Family;Available 24 hours/day Type of Home: Mobile home Home Access: Stairs to enter Entrance Stairs-Number of Steps: 5 Entrance Stairs-Rails: Right;Left Home Layout: One level     Bathroom Shower/Tub: Chief Strategy OfficerTub/shower unit   Bathroom Toilet: Standard     Home Equipment: None          Prior Functioning/Environment Level of Independence: Independent        Comments: Has been working but over the past 3 weeks has required assistance from family for tub transfers and has been getting progressively weaker. Reports recent change to 12 hour shifts at work.         OT Problem List: Decreased strength;Decreased range of motion;Decreased activity tolerance;Impaired balance (sitting and/or standing);Decreased safety awareness;Decreased knowledge of use of DME or AE;Decreased knowledge of precautions;Pain      OT Treatment/Interventions: Self-care/ADL training;Therapeutic exercise;Energy conservation;DME and/or AE instruction;Patient/family education;Therapeutic activities;Cognitive remediation/compensation;Balance training    OT Goals(Current goals can be found in the care plan  section) Acute Rehab OT Goals Patient Stated Goal: get stronger OT Goal Formulation: With patient/family Time For Goal Achievement: 06/09/17 Potential to Achieve Goals: Good  OT Frequency: Min 3X/week   Barriers to D/C:            Co-evaluation              AM-PAC PT "6 Clicks" Daily Activity     Outcome Measure Help from another person eating meals?: A Little Help from another person taking care of personal grooming?: A Little Help from another person toileting, which includes using toliet, bedpan, or urinal?: A Little Help from another person bathing (including washing, rinsing, drying)?: A Little Help from another person to put on and taking off regular upper body clothing?: A Little Help from another person to put on and taking off regular lower body clothing?: A Little 6 Click Score: 18   End of Session Equipment Utilized During Treatment: Gait belt;Rolling walker Nurse Communication: Mobility status  Activity Tolerance: Patient tolerated treatment well Patient left: in chair;with call bell/phone within reach  OT Visit Diagnosis: Other abnormalities of gait and mobility (R26.89);Muscle weakness (generalized) (M62.81)                Time: [id number] OT Time Calculation (min): 22 min Charges:  OT General Charges $OT Visit: 1 Visit OT Evaluation $OT Eval Moderate Complexity: 1 Mod G-Codes:     Doristine Sectionharity A Shamona Wirtz, MS OTR/L  Pager: [phone]   Belinda Schlichting A Nayara Taplin 05/26/2017, 1:21 PM

## 2017-05-26 NOTE — Progress Notes (Signed)
Notified by CCMD pt had converted to AFIB w/HR 110-130's. Confirmed by EKG Pt denies palpitations/denies CP. BP 120's/70. Provider on call notified w/orders received. Will continue to monitor. Dierdre HighmanHall, Chrisie Jankovich Marie, RN

## 2017-05-26 NOTE — Consult Note (Signed)
Hospital Consult    Reason for Consult: Need for dialysis access  Requesting Physician:   Dr. Upton MRN #:  [id number]  History of Present Illness: This is a 66 y.o. male with history of diabetes mellitus, CHF, and CKD stage V with recent renal failure.  He is seen in consultation this morning with need for dialysis access placement.  Patient was seen during last admission plan was to schedule dialysis access placement as an outpatient.  Video interpreter was used during exam.  Patient denies SOB.  He is R arm dominant and would like to proceed with dialysis access placement in L arm.    Past Medical History:  Diagnosis Date  . CHF (congestive heart failure) (HCC)   . Chronic kidney disease   . Diabetes (HCC)   . Dyspnea     Past Surgical History:  Procedure Laterality Date  . NO PAST SURGERIES      No Known Allergies  Prior to Admission medications   Medication Sig Start Date End Date Taking? Authorizing Provider  carvedilol (COREG) 12.5 MG tablet Take 1 tablet (12.5 mg total) by mouth 2 (two) times daily with a meal. 05/17/17  Yes Mathews, Elizabeth G, MD  furosemide (LASIX) 40 MG tablet Take 1 tablet (40 mg total) by mouth daily. 05/18/17  Yes Mathews, Elizabeth G, MD  hydrALAZINE (APRESOLINE) 100 MG tablet Take 1 tablet (100 mg total) by mouth every 8 (eight) hours. 05/17/17  Yes Mathews, Elizabeth G, MD  insulin aspart protamine- aspart (NOVOLOG MIX 70/30) (70-30) 100 UNIT/ML injection Inject 0.12 mLs (12 Units total) into the skin 2 (two) times daily with a meal. 05/17/17  Yes Mathews, Elizabeth G, MD  isosorbide mononitrate (IMDUR) 30 MG 24 hr tablet Take 3 tablets (90 mg total) by mouth daily. 05/18/17  Yes Mathews, Elizabeth G, MD  polyethylene glycol (MIRALAX / GLYCOLAX) packet Take 17 g by mouth daily. 05/18/17  Yes Mathews, Elizabeth G, MD  Vitamin D, Ergocalciferol, (DRISDOL) 50000 units CAPS capsule Take 1 capsule (50,000 Units total) by mouth every 7 (seven) days. 05/23/17  Yes  Mathews, Elizabeth G, MD    Social History   Socioeconomic History  . Marital status: Married    Spouse name: Not on file  . Number of children: Not on file  . Years of education: Not on file  . Highest education level: Not on file  Social Needs  . Financial resource strain: Not on file  . Food insecurity - worry: Not on file  . Food insecurity - inability: Not on file  . Transportation needs - medical: Not on file  . Transportation needs - non-medical: Not on file  Occupational History  . Occupation: factory  Tobacco Use  . Smoking status: Never Smoker  . Smokeless tobacco: Never Used  Substance and Sexual Activity  . Alcohol use: No    Frequency: Never  . Drug use: No  . Sexual activity: No  Other Topics Concern  . Not on file  Social History Narrative  . Not on file     History reviewed. No pertinent family history.  ROS: Otherwise negative unless mentioned in HPI  Physical Examination  Vitals:   05/26/17 0423 05/26/17 0842  BP: 136/73 119/67  Pulse: 74 77  Resp: 15   Temp: 98.8 F (37.1 C)   SpO2: 95%    Body mass index is 41.23 kg/m.  General:  WDWN in NAD Gait: Not observed HENT: WNL, normocephalic Pulmonary: normal non-labored breathing, without Rales, rhonchi,    wheezing Cardiac: regular Abdomen: soft, NT/ND, no masses Skin: without rashes Vascular Exam/Pulses: symmetrical radial pulses Extremities: without ischemic changes, without Gangrene , without cellulitis; without open wounds;  Musculoskeletal: no muscle wasting or atrophy  Neurologic: A&O X 3;  No focal weakness or paresthesias are detected; speech is fluent/normal Psychiatric:  The pt has Normal affect. Lymph:  Unremarkable  CBC    Component Value Date/Time   WBC 6.4 05/26/2017 0658   RBC 3.73 (L) 05/26/2017 0658   HGB 11.5 (L) 05/26/2017 0658   HCT 36.3 (L) 05/26/2017 0658   PLT 216 05/26/2017 0658   MCV 97.3 05/26/2017 0658   MCH 30.8 05/26/2017 0658   MCHC 31.7 05/26/2017  0658   RDW 13.5 05/26/2017 0658    BMET    Component Value Date/Time   NA 139 05/26/2017 0658   K 4.7 05/26/2017 0658   CL 107 05/26/2017 0658   CO2 21 (L) 05/26/2017 0658   GLUCOSE 107 (H) 05/26/2017 0658   BUN 87 (H) 05/26/2017 0658   CREATININE 5.54 (H) 05/26/2017 0658   CALCIUM 8.3 (L) 05/26/2017 0658   GFRNONAA 10 (L) 05/26/2017 0658   GFRAA 11 (L) 05/26/2017 0658    COAGS: Lab Results  Component Value Date   INR 0.97 05/13/2017     Non-Invasive Vascular Imaging:   UPPER EXTREMITY VEIN MAPPING History: Pre-access.  Examination Guidelines: A complete evaluation includes B-mode imaging, spectral doppler, color doppler, and power doppler as needed of all accessible portions of each vessel. Bilateral testing is considered an integral part of a complete examination. Limited examinations for reoccurring indications may be performed as noted.  +-----------------+-------------+----------+---------+ Right Cephalic  Diameter (mm)Depth (mm)Findings  +-----------------+-------------+----------+---------+ Shoulder       4.90     9.00        +-----------------+-------------+----------+---------+ Prox upper arm    6.40    10.00        +-----------------+-------------+----------+---------+ Mid upper arm    5.60     1.90        +-----------------+-------------+----------+---------+ Dist upper arm    4.90     1.60        +-----------------+-------------+----------+---------+ Antecubital fossa  7.20     1.20        +-----------------+-------------+----------+---------+ Prox forearm     4.60     3.70  branching +-----------------+-------------+----------+---------+ Mid forearm     4.60     3.10        +-----------------+-------------+----------+---------+ Dist forearm                 IV    +-----------------+-------------+----------+---------+  +-----------------+-------------+----------+---------+ Left Cephalic  Diameter (mm)Depth (mm)Findings  +-----------------+-------------+----------+---------+ Shoulder       6.10    10.80        +-----------------+-------------+----------+---------+ Prox upper arm    6.50     4.50  branching +-----------------+-------------+----------+---------+ Mid upper arm    5.50     1.40        +-----------------+-------------+----------+---------+ Dist upper arm    6.90     1.60        +-----------------+-------------+----------+---------+ Antecubital fossa  6.70     3.30  branching +-----------------+-------------+----------+---------+ Prox forearm     4.80     5.70        +-----------------+-------------+----------+---------+ Mid forearm     4.60     4.10        +-----------------+-------------+----------+---------+ Dist forearm     3.90     2.20  branching +-----------------+-------------+----------+---------+   Final   Interpretation      *See table(s) above for measurements and observations.  Christopher Dickson  Electronically signed by Christopher Dickson on 05/17/2017 at 4:19:30 PM.  UPPER EXTREMITY DUPLEX STUDY  Indications: Pre HD Access.  Examination Guidelines: A complete evaluation includes B-mode imaging, spectral doppler, color doppler, and power doppler as needed of all accessible portions of each vessel. Bilateral testing is considered an integral part of a complete examination. Limited examinations for reoccurring indications may be performed as noted.   Right Doppler Findings: +----------+----------+---------+ Site   PSV (cm/s)Waveform  +----------+----------+---------+ Subclavian109    triphasic +----------+----------+---------+ Axillary 185           +----------+----------+---------+ Brachial 126    triphasic +----------+----------+---------+ Radial  78    triphasic +----------+----------+---------+ Ulnar   74    triphasic +----------+----------+---------+  Left Doppler Findings: +----------+----------+---------+------+--------+ Site   PSV (cm/s)Waveform PlaqueComments +----------+----------+---------+------+--------+ Subclavian153    triphasic        +----------+----------+---------+------+--------+ Axillary 130    triphasic        +----------+----------+---------+------+--------+ Brachial 97    triphasic        +----------+----------+---------+------+--------+ Radial  67    triphasic        +----------+----------+---------+------+--------+ Ulnar   72    triphasic        +----------+----------+---------+------+--------+  Final Interpretation:  Right: <50% stenosis visualized in the right upper extremity. Left: <50% stenosis visualized in the left upper extremity. *See table(s) above for measurements and observations.   Electronically signed by Christopher Dickson on 05/17/2017 at 4:23:38 PM.  Statin:  No. Beta Blocker:  Yes.   Aspirin:  No. ACEI:  No. ARB:  No. CCB use:  No Other antiplatelets/anticoagulants:  No.    ASSESSMENT/PLAN: This is a 66 y.o. male with CKD V now with ARF and will require HD during this admission  Vein conduits appear adequate in L upper arm; likely hx of phlebitis in AC fossa given scarring Restrict L arm Plan is for TDC catheter placement as well as AV fistula during this admission Risks and benefits of surgery were reviewed with the use of video interpreter and all of the patient's questions were answered Timing of surgery will be discussed with on call surgeon   Matthew Eveland PA-C [facility] [phone]  I have examined the  patient, reviewed and agree with above.  Has distended surface veins in his left arm with probable volume overload.  Discussed with the patient and his wife via video interpreter plan for tunneled catheter and left arm access with Dr. Dickson  tomorrow.  Aaiden Depoy, MD 05/26/2017 6:10 PM  

## 2017-05-26 NOTE — Progress Notes (Signed)
PROGRESS NOTE    Nicholas Gutierrez  ZOX:[id number] DOB: 1950/06/17 DOA: 05/24/2017 PCP: Patient, No Pcp Per     Brief Narrative:  Nicholas Gutierrez is a 67 y.o.  Spanish speaking male with medical history significant for DM, CKD stage 5, glomerulosclerosis, long admission from 12/26 to [facility], then transferred to [facility] on 05/11/2017 till 05/17/2016 with acute renal failure.  At the time, he had been diagnosed with hypertensive urgency, type 2 diabetes, and nephropathy, he also was noted to have an adrenal mass, acute renal arterial thrombosis, nephrotic syndrome with hypoalbuminemia, chronic glomerulonephritis, and as mentioned CKD.  He was supposed to have a nephrologist evaluation on 06/03/2017 to discuss HD cath insertion. He now presents to the ED with progressive shortness of breath since his discharge, stating that he continues to have low amount of urine despite lasix.  He also complains of worsening lower extremity edema, which does not allow him to ambulate properly, stating the legs are too heavy.  In addition, he cannot ambulate more than from bed to the bathroom without becoming very short of breath.  He reports orthopnea as well.  He is sleeping with more than 2-3 pillows. CT chest revealed bilateral pleural effusions. Patient was admitted for further evaluation and treatment.   Assessment & Plan:   Active Problems:   Type 2 diabetes with nephropathy (HCC)   Acute diastolic CHF (congestive heart failure) (HCC)   BPH with urinary obstruction   Adrenal mass (HCC)   Chronic kidney disease (CKD), stage V (HCC)   Healthcare-associated pneumonia   Acute respiratory failure (HCC)   Acute exacerbation of CHF (congestive heart failure) (HCC)   Volume overload due to acute on chronic diastolic heart failure as well as nephrotic syndrome/CKD stage V -BNP 1253 on admission  -Nephrology consulted, lasix 160mg  IV every 6 hours, zoroxolyn 5mg  BID  -Vascular surgery to evaluate for [facility] and  AV fistula    ?HCAP  -CT chest: Patchy consolidations throughout both lungs suspicious for pneumonia -Thought to be more consistent with fluid. Antibiotics stopped.   Hypertension  -Continue coreg, imdur, hydralazine    Type II Diabetes  -HA1C 6.2 -SSI  Incidental adrenal mass -Follow up outpatient   DVT prophylaxis: subq hep Code Status: full Family Communication: at bedside Disposition Plan: pending vascular surgery procedure, dialysis initiation    Consultants:   Nephrology  Vascular surgery   Procedures:   None   Antimicrobials:  Anti-infectives (From admission, onward)   Start     Dose/Rate Route Frequency Ordered Stop   05/25/17 1630  ceFEPIme (MAXIPIME) 500 mg in dextrose 5 % 50 mL IVPB  Status:  Discontinued     500 mg 100 mL/hr over 30 Minutes Intravenous Every 24 hours 05/24/17 1730 05/25/17 1649   05/24/17 1600  vancomycin (VANCOCIN) 2,000 mg in sodium chloride 0.9 % 500 mL IVPB     2,000 mg 250 mL/hr over 120 Minutes Intravenous  Once 05/24/17 1551 05/24/17 1958   05/24/17 1545  ceFEPIme (MAXIPIME) 1 g in dextrose 5 % 50 mL IVPB     1 g 100 mL/hr over 30 Minutes Intravenous  Once 05/24/17 1536 05/24/17 1713       Subjective: Patient seen with family at bedside, using [employer] interpreter. States his breathing is slightly better but swelling persists   Objective: Vitals:   05/25/17 2057 05/25/17 2300 05/26/17 0423 05/26/17 0842  BP: (!) 114/57 (!) 103/52 136/73 119/67  Pulse: 64 67 74 77  Resp: 16 14  15   Temp: 98.5 F (36.9 C) 98 F (36.7 C) 98.8 F (37.1 C)   TempSrc: Oral Oral Oral   SpO2: 96% 91% 95%   Weight:   102.2 kg (225 lb 6.4 oz)   Height:        Intake/Output Summary (Last 24 hours) at 05/26/2017 1157 Last data filed at 05/26/2017 0800 Gross per 24 hour  Intake 978 ml  Output 2690 ml  Net -1712 ml   Filed Weights   05/24/17 2104 05/25/17 0508 05/26/17 0423  Weight: 104.8 kg (231 lb) 104.3 kg (229 lb 14.4 oz) 102.2 kg  (225 lb 6.4 oz)    Examination:  General exam: Appears calm and comfortable  Respiratory system: Diminished breath sounds bilateral bases  Cardiovascular system: S1 & S2 heard, RRR. No JVD, murmurs, rubs, gallops or clicks. +2 pedal edema. Gastrointestinal system: Abdomen is soft and nontender. No organomegaly or masses felt. Normal bowel sounds heard. Central nervous system: Alert and oriented. No focal neurological deficits. Extremities: Symmetric Skin: No rashes, lesions or ulcers Psychiatry: Judgement and insight appear normal. Mood & affect appropriate.   Data Reviewed: I have personally reviewed following labs and imaging studies  CBC: Recent Labs  Lab 05/24/17 1154 05/25/17 0839 05/26/17 0658  WBC 6.9 6.0 6.4  HGB 11.7* 12.3* 11.5*  HCT 35.7* 39.3 36.3*  MCV 96.5 98.0 97.3  PLT 204 240 216   Basic Metabolic Panel: Recent Labs  Lab 05/24/17 1154 05/25/17 0839 05/26/17 0658  NA 136 138 139  K 5.3* 5.3* 4.7  CL 107 108 107  CO2 18* 19* 21*  GLUCOSE 157* 122* 107*  BUN 90* 86* 87*  CREATININE 5.58* 5.35* 5.54*  CALCIUM 8.1* 8.4* 8.3*   GFR: Estimated Creatinine Clearance: 14 mL/min (A) (by C-G formula based on SCr of 5.54 mg/dL (H)). Liver Function Tests: Recent Labs  Lab 05/24/17 1605  AST 18  ALT 16*  ALKPHOS 105  BILITOT 0.7  PROT 6.1*  ALBUMIN 2.4*   Recent Labs  Lab 05/24/17 1605  LIPASE 27   No results for input(s): AMMONIA in the last 168 hours. Coagulation Profile: No results for input(s): INR, PROTIME in the last 168 hours. Cardiac Enzymes: No results for input(s): CKTOTAL, CKMB, CKMBINDEX, TROPONINI in the last 168 hours. BNP (last 3 results) No results for input(s): PROBNP in the last 8760 hours. HbA1C: Recent Labs    05/24/17 1736  HGBA1C 6.2*   CBG: Recent Labs  Lab 05/25/17 0840 05/25/17 1205 05/25/17 1649 05/25/17 2055 05/26/17 0805  GLUCAP 117* 131* 167* 159* 108*   Lipid Profile: No results for input(s): CHOL,  HDL, LDLCALC, TRIG, CHOLHDL, LDLDIRECT in the last 72 hours. Thyroid Function Tests: Recent Labs    05/24/17 1736  TSH 3.877   Anemia Panel: Recent Labs    05/25/17 1842  FERRITIN 52  TIBC 253  IRON 36*   Sepsis Labs: No results for input(s): PROCALCITON, LATICACIDVEN in the last 168 hours.  Recent Results (from the past 240 hour(s))  Urine culture     Status: None   Collection Time: 05/24/17  3:33 PM  Result Value Ref Range Status   Specimen Description URINE, CLEAN CATCH  Final   Special Requests NONE  Final   Culture NO GROWTH  Final   Report Status 05/25/2017 FINAL  Final  Blood culture (routine x 2)     Status: None (Preliminary result)   Collection Time: 05/24/17  4:15 PM  Result Value Ref Range Status  Specimen Description BLOOD RIGHT ANTECUBITAL  Final   Special Requests   Final    BOTTLES DRAWN AEROBIC AND ANAEROBIC Blood Culture results may not be optimal due to an excessive volume of blood received in culture bottles   Culture NO GROWTH < 24 HOURS  Final   Report Status PENDING  Incomplete  Blood culture (routine x 2)     Status: None (Preliminary result)   Collection Time: 05/24/17  4:20 PM  Result Value Ref Range Status   Specimen Description BLOOD LEFT ANTECUBITAL  Final   Special Requests   Final    BOTTLES DRAWN AEROBIC AND ANAEROBIC Blood Culture results may not be optimal due to an excessive volume of blood received in culture bottles   Culture NO GROWTH < 24 HOURS  Final   Report Status PENDING  Incomplete  MRSA PCR Screening     Status: None   Collection Time: 05/24/17  9:56 PM  Result Value Ref Range Status   MRSA by PCR NEGATIVE NEGATIVE Final    Comment:        The GeneXpert MRSA Assay (FDA approved for NASAL specimens only), is one component of a comprehensive MRSA colonization surveillance program. It is not intended to diagnose MRSA infection nor to guide or monitor treatment for MRSA infections.        Radiology Studies: Ct  Abdomen Pelvis Wo Contrast  Result Date: 05/24/2017 CLINICAL DATA:  Patient has a history of congestive heart failure. Increasing abdominal distension in the last few days. EXAM: CT CHEST AND ABDOMEN WITHOUT CONTRAST TECHNIQUE: Multidetector CT imaging of the chest and abdomen was performed following the standard protocol without intravenous contrast. COMPARISON:  None. FINDINGS: CT CHEST FINDINGS WITHOUT CONTRAST Cardiovascular: Atherosclerosis of the aorta is identified. The heart size is enlarged. There is no pericardial effusion. Mediastinum/Nodes: There are mild prominent mediastinal and hilar lymph nodes largest measuring 1 cm. These may be reactive lymph nodes. The trachea is normal. The esophagus is unremarkable. The thyroid glands are normal. Lungs/Pleura: There are large bilateral pleural effusions. There are patchy consolidations most prominently involving the bilateral lower lobes but also to a lesser degree involves the right upper lobe and right middle lobe. Musculoskeletal: No acute abnormality. CT ABDOMEN FINDINGS WITHOUT CONTRAST Hepatobiliary: Left lobe liver is enlarged with mild nodular contour of liver suggesting cirrhosis of liver. No focal liver lesion is identified. The gallbladder is normal. Minimal ascites is noted surrounding gallbladder. The biliary tree is normal. Pancreas: Unremarkable. No pancreatic ductal dilatation or surrounding inflammatory changes. Spleen: Normal in size without focal abnormality. Adrenals/Urinary Tract: Adrenal glands are unremarkable. Kidneys are normal, without renal calculi, focal lesion, or hydronephrosis. Minimal bilateral perinephric fluid is identified. Bladder is unremarkable. Stomach/Bowel: Stomach is within normal limits. Appendix appears normal. No evidence of bowel wall thickening, distention, or inflammatory changes. Vascular/Lymphatic: Aortic atherosclerosis. No enlarged abdominal or pelvic lymph nodes. Other: Minimal ascites is identified in the  abdomen. Musculoskeletal: No acute abnormality is noted. IMPRESSION: Large bilateral pleural effusions. Patchy consolidations throughout both lungs suspicious for pneumonia. Minimal ascites in the abdomen. No acute abnormality identified in the abdomen and pelvis. Electronically Signed   By: Sherian Rein M.D.   On: 05/24/2017 15:04   Dg Chest 2 View  Result Date: 05/25/2017 CLINICAL DATA:  CHF, fluid overload, pneumonia EXAM: CHEST  2 VIEW COMPARISON:  05/24/2017 FINDINGS: Cardiomegaly with vascular congestion. Interstitial prominence in the lungs may reflect interstitial edema. Small bilateral effusions and bibasilar atelectasis. IMPRESSION: Mild interstitial  edema. Small effusions with bibasilar atelectasis. Electronically Signed   By: Charlett NoseKevin  Dover M.D.   On: 05/25/2017 08:13   Dg Chest 2 View  Result Date: 05/24/2017 CLINICAL DATA:  Shortness of breath, chest pain and history of CHF. EXAM: CHEST  2 VIEW COMPARISON:  12/01/2006 FINDINGS: The heart size and mediastinal contours are within normal limits. There is evidence mild to moderate pulmonary edema and probable small bilateral pleural effusions. Atelectasis present at both lung bases. The visualized skeletal structures are unremarkable. IMPRESSION: Mild to moderate pulmonary edema. Electronically Signed   By: Irish LackGlenn  Yamagata M.D.   On: 05/24/2017 12:41   Ct Chest Wo Contrast  Result Date: 05/24/2017 CLINICAL DATA:  Patient has a history of congestive heart failure. Increasing abdominal distension in the last few days. EXAM: CT CHEST AND ABDOMEN WITHOUT CONTRAST TECHNIQUE: Multidetector CT imaging of the chest and abdomen was performed following the standard protocol without intravenous contrast. COMPARISON:  None. FINDINGS: CT CHEST FINDINGS WITHOUT CONTRAST Cardiovascular: Atherosclerosis of the aorta is identified. The heart size is enlarged. There is no pericardial effusion. Mediastinum/Nodes: There are mild prominent mediastinal and hilar  lymph nodes largest measuring 1 cm. These may be reactive lymph nodes. The trachea is normal. The esophagus is unremarkable. The thyroid glands are normal. Lungs/Pleura: There are large bilateral pleural effusions. There are patchy consolidations most prominently involving the bilateral lower lobes but also to a lesser degree involves the right upper lobe and right middle lobe. Musculoskeletal: No acute abnormality. CT ABDOMEN FINDINGS WITHOUT CONTRAST Hepatobiliary: Left lobe liver is enlarged with mild nodular contour of liver suggesting cirrhosis of liver. No focal liver lesion is identified. The gallbladder is normal. Minimal ascites is noted surrounding gallbladder. The biliary tree is normal. Pancreas: Unremarkable. No pancreatic ductal dilatation or surrounding inflammatory changes. Spleen: Normal in size without focal abnormality. Adrenals/Urinary Tract: Adrenal glands are unremarkable. Kidneys are normal, without renal calculi, focal lesion, or hydronephrosis. Minimal bilateral perinephric fluid is identified. Bladder is unremarkable. Stomach/Bowel: Stomach is within normal limits. Appendix appears normal. No evidence of bowel wall thickening, distention, or inflammatory changes. Vascular/Lymphatic: Aortic atherosclerosis. No enlarged abdominal or pelvic lymph nodes. Other: Minimal ascites is identified in the abdomen. Musculoskeletal: No acute abnormality is noted. IMPRESSION: Large bilateral pleural effusions. Patchy consolidations throughout both lungs suspicious for pneumonia. Minimal ascites in the abdomen. No acute abnormality identified in the abdomen and pelvis. Electronically Signed   By: Sherian ReinWei-Chen  Lin M.D.   On: 05/24/2017 15:04      Scheduled Meds: . carvedilol  12.5 mg Oral BID WC  . heparin  5,000 Units Subcutaneous Q8H  . hydrALAZINE  100 mg Oral Q8H  . insulin aspart  0-9 Units Subcutaneous TID WC  . isosorbide mononitrate  90 mg Oral Daily  . mouth rinse  15 mL Mouth Rinse BID  .  metolazone  5 mg Oral BID   Continuous Infusions: . furosemide 160 mg (05/26/17 1100)     LOS: 2 days    Time spent: 30 minutes   Noralee StainJennifer Stacye Noori, DO [facility] [email or web address] Password [email or web address] 05/26/2017, 11:57 AM

## 2017-05-27 ENCOUNTER — Inpatient Hospital Stay (HOSPITAL_COMMUNITY): Payer: Commercial Managed Care - PPO

## 2017-05-27 ENCOUNTER — Encounter (HOSPITAL_COMMUNITY)
Admission: EM | Disposition: A | Discharge status: Home Health | Payer: Self-pay | Source: Home / Self Care | Attending: Internal Medicine

## 2017-05-27 DIAGNOSIS — I5031 Acute diastolic (congestive) heart failure: Secondary | ICD-10-CM

## 2017-05-27 DIAGNOSIS — I5023 Acute on chronic systolic (congestive) heart failure: Secondary | ICD-10-CM

## 2017-05-27 HISTORY — PX: INSERTION OF DIALYSIS CATHETER: SHX1324

## 2017-05-27 HISTORY — PX: AV FISTULA PLACEMENT: SHX1204

## 2017-05-27 LAB — BASIC METABOLIC PANEL
ANION GAP: 11 (ref 5–15)
ANION GAP: 12 (ref 5–15)
BUN: 90 mg/dL — AB (ref 6–20)
BUN: 87 mg/dL — ABNORMAL HIGH (ref 6–20)
CHLORIDE: 104 mmol/L (ref 101–111)
CO2: 22 mmol/L (ref 22–32)
CO2: 23 mmol/L (ref 22–32)
Calcium: 8.1 mg/dL — ABNORMAL LOW (ref 8.9–10.3)
Calcium: 8.2 mg/dL — ABNORMAL LOW (ref 8.9–10.3)
Chloride: 103 mmol/L (ref 101–111)
Creatinine, Ser: 5.8 mg/dL — ABNORMAL HIGH (ref 0.61–1.24)
Creatinine, Ser: 5.71 mg/dL — ABNORMAL HIGH (ref 0.61–1.24)
GFR calc Af Amer: 11 mL/min — ABNORMAL LOW (ref >60)
GFR calc non Af Amer: 9 mL/min — ABNORMAL LOW (ref >60)
GFR, EST AFRICAN AMERICAN: 11 mL/min — AB (ref >60)
GFR, EST NON AFRICAN AMERICAN: 9 mL/min — AB (ref >60)
GLUCOSE: 126 mg/dL — AB (ref 65–99)
Glucose, Bld: 121 mg/dL — ABNORMAL HIGH (ref 65–99)
POTASSIUM: 4.1 mmol/L (ref 3.5–5.1)
POTASSIUM: 4.2 mmol/L (ref 3.5–5.1)
SODIUM: 138 mmol/L (ref 135–145)
Sodium: 137 mmol/L (ref 135–145)

## 2017-05-27 LAB — GLUCOSE, CAPILLARY
GLUCOSE-CAPILLARY: 141 mg/dL — AB (ref 65–99)
GLUCOSE-CAPILLARY: 124 mg/dL — AB (ref 65–99)
GLUCOSE-CAPILLARY: 131 mg/dL — AB (ref 65–99)
Glucose-Capillary: 113 mg/dL — ABNORMAL HIGH (ref 65–99)
Glucose-Capillary: 117 mg/dL — ABNORMAL HIGH (ref 65–99)
Glucose-Capillary: 136 mg/dL — ABNORMAL HIGH (ref 65–99)

## 2017-05-27 LAB — MAGNESIUM: Magnesium: 2.9 mg/dL — ABNORMAL HIGH (ref 1.7–2.4)

## 2017-05-27 LAB — TROPONIN I
Troponin I: <0.03 ng/mL (ref <0.03)
Troponin I: <0.03 ng/mL (ref <0.03)

## 2017-05-27 LAB — SURGICAL PCR SCREEN
MRSA, PCR: NEGATIVE
Staphylococcus aureus: NEGATIVE

## 2017-05-27 SURGERY — INSERTION OF DIALYSIS CATHETER
Anesthesia: General | Site: Neck | Laterality: Right

## 2017-05-27 MED ORDER — WARFARIN SODIUM 5 MG PO TABS
5.0000 mg | ORAL_TABLET | Freq: Once | ORAL | Status: AC
  Administered 2017-05-27: 5 mg via ORAL
  Filled 2017-05-27: qty 1

## 2017-05-27 MED ORDER — LIDOCAINE 2% (20 MG/ML) 5 ML SYRINGE
INTRAMUSCULAR | Status: AC
  Filled 2017-05-27: qty 5

## 2017-05-27 MED ORDER — PHENYLEPHRINE HCL 10 MG/ML IJ SOLN
INTRAMUSCULAR | Status: DC | PRN
  Administered 2017-05-27: 80 ug via INTRAVENOUS

## 2017-05-27 MED ORDER — HEPARIN SODIUM (PORCINE) 1000 UNIT/ML IJ SOLN
INTRAMUSCULAR | Status: DC | PRN
  Administered 2017-05-27: 5000 [IU] via INTRAVENOUS

## 2017-05-27 MED ORDER — FENTANYL CITRATE (PF) 100 MCG/2ML IJ SOLN
INTRAMUSCULAR | Status: DC | PRN
  Administered 2017-05-27: 100 ug via INTRAVENOUS
  Administered 2017-05-27: 25 ug via INTRAVENOUS

## 2017-05-27 MED ORDER — MIDAZOLAM HCL 5 MG/5ML IJ SOLN
INTRAMUSCULAR | Status: DC | PRN
  Administered 2017-05-27: 1 mg via INTRAVENOUS

## 2017-05-27 MED ORDER — SODIUM CHLORIDE 0.9 % IV SOLN
INTRAVENOUS | Status: DC | PRN
  Administered 2017-05-27: 11:00:00 500 mL

## 2017-05-27 MED ORDER — ALBUTEROL SULFATE HFA 108 (90 BASE) MCG/ACT IN AERS
INHALATION_SPRAY | RESPIRATORY_TRACT | Status: DC | PRN
  Administered 2017-05-27: 8 via RESPIRATORY_TRACT

## 2017-05-27 MED ORDER — CEFAZOLIN SODIUM-DEXTROSE 1-4 GM/50ML-% IV SOLN
INTRAVENOUS | Status: AC
  Filled 2017-05-27: qty 50

## 2017-05-27 MED ORDER — SODIUM CHLORIDE 0.9 % IV SOLN
Freq: Once | INTRAVENOUS | Status: AC
  Administered 2017-05-27: 09:00:00 via INTRAVENOUS

## 2017-05-27 MED ORDER — INSULIN ASPART 100 UNIT/ML ~~LOC~~ SOLN
0.0000 [IU] | SUBCUTANEOUS | Status: DC
  Administered 2017-05-27 – 2017-05-29 (×5): 1 [IU] via SUBCUTANEOUS
  Administered 2017-05-29 (×2): 2 [IU] via SUBCUTANEOUS

## 2017-05-27 MED ORDER — CEFAZOLIN SODIUM-DEXTROSE 1-4 GM/50ML-% IV SOLN
1.0000 g | Freq: Once | INTRAVENOUS | Status: AC
  Administered 2017-05-27: 1 g via INTRAVENOUS

## 2017-05-27 MED ORDER — FENTANYL CITRATE (PF) 250 MCG/5ML IJ SOLN
INTRAMUSCULAR | Status: AC
  Filled 2017-05-27: qty 5

## 2017-05-27 MED ORDER — ASPIRIN 81 MG PO CHEW
324.0000 mg | CHEWABLE_TABLET | Freq: Once | ORAL | Status: AC
  Administered 2017-05-27: 324 mg via ORAL
  Filled 2017-05-27: qty 4

## 2017-05-27 MED ORDER — DILTIAZEM LOAD VIA INFUSION
10.0000 mg | Freq: Once | INTRAVENOUS | Status: AC
  Administered 2017-05-27: 10 mg via INTRAVENOUS
  Filled 2017-05-27: qty 10

## 2017-05-27 MED ORDER — HEPARIN SODIUM (PORCINE) 1000 UNIT/ML IJ SOLN
INTRAMUSCULAR | Status: DC | PRN
  Administered 2017-05-27: 1000 [IU]

## 2017-05-27 MED ORDER — 0.9 % SODIUM CHLORIDE (POUR BTL) OPTIME
TOPICAL | Status: DC | PRN
  Administered 2017-05-27: 1000 mL

## 2017-05-27 MED ORDER — OXYCODONE-ACETAMINOPHEN 5-325 MG PO TABS
1.0000 | ORAL_TABLET | Freq: Four times a day (QID) | ORAL | Status: DC | PRN
  Administered 2017-05-27 (×2): 1 via ORAL
  Filled 2017-05-27 (×2): qty 1

## 2017-05-27 MED ORDER — HYDROMORPHONE HCL 1 MG/ML IJ SOLN: 0.2500 mg | INTRAMUSCULAR | Status: DC | PRN

## 2017-05-27 MED ORDER — LIDOCAINE HCL (PF) 1 % IJ SOLN
INTRAMUSCULAR | Status: AC
  Filled 2017-05-27: qty 30

## 2017-05-27 MED ORDER — LIDOCAINE HCL (CARDIAC) 20 MG/ML IV SOLN
INTRAVENOUS | Status: DC | PRN
Start: 2017-05-27 — End: 2017-05-27
  Administered 2017-05-27: 40 mg via INTRATRACHEAL

## 2017-05-27 MED ORDER — DILTIAZEM HCL-DEXTROSE 100-5 MG/100ML-% IV SOLN (PREMIX)
5.0000 mg/h | INTRAVENOUS | Status: DC
  Administered 2017-05-27: 5 mg/h via INTRAVENOUS
  Filled 2017-05-27: qty 100

## 2017-05-27 MED ORDER — OXYCODONE HCL 5 MG PO TABS: 5.0000 mg | ORAL_TABLET | Freq: Once | ORAL | Status: DC | PRN

## 2017-05-27 MED ORDER — ONDANSETRON HCL 4 MG/2ML IJ SOLN
INTRAMUSCULAR | Status: DC | PRN
  Administered 2017-05-27: 4 mg via INTRAVENOUS

## 2017-05-27 MED ORDER — PHENYLEPHRINE HCL 10 MG/ML IJ SOLN
INTRAVENOUS | Status: DC | PRN
  Administered 2017-05-27: 50 ug/min via INTRAVENOUS

## 2017-05-27 MED ORDER — ASPIRIN 81 MG PO CHEW: 324.0000 mg | CHEWABLE_TABLET | Freq: Once | ORAL | Status: DC

## 2017-05-27 MED ORDER — WARFARIN - PHARMACIST DOSING INPATIENT
Freq: Every day | Status: DC
  Administered 2017-05-28: 18:00:00

## 2017-05-27 MED ORDER — OXYCODONE HCL 5 MG/5ML PO SOLN: 5.0000 mg | Freq: Once | ORAL | Status: DC | PRN

## 2017-05-27 MED ORDER — PROPOFOL 10 MG/ML IV BOLUS
INTRAVENOUS | Status: AC
  Filled 2017-05-27: qty 20

## 2017-05-27 MED ORDER — ONDANSETRON HCL 4 MG/2ML IJ SOLN
INTRAMUSCULAR | Status: AC
  Filled 2017-05-27: qty 2

## 2017-05-27 MED ORDER — MIDAZOLAM HCL 2 MG/2ML IJ SOLN
INTRAMUSCULAR | Status: AC
  Filled 2017-05-27: qty 2

## 2017-05-27 MED ORDER — HEPARIN SODIUM (PORCINE) 5000 UNIT/ML IJ SOLN
5000.0000 [IU] | Freq: Three times a day (TID) | INTRAMUSCULAR | Status: DC
  Administered 2017-05-27 – 2017-05-30 (×8): 5000 [IU] via SUBCUTANEOUS
  Filled 2017-05-27 (×8): qty 1

## 2017-05-27 MED ORDER — HEPARIN SODIUM (PORCINE) 1000 UNIT/ML IJ SOLN
INTRAMUSCULAR | Status: AC
  Filled 2017-05-27: qty 1

## 2017-05-27 MED ORDER — ONDANSETRON HCL 4 MG/2ML IJ SOLN: 4.0000 mg | Freq: Once | INTRAMUSCULAR | Status: DC | PRN

## 2017-05-27 MED ORDER — ASPIRIN 300 MG RE SUPP: 300.0000 mg | Freq: Once | RECTAL | Status: DC

## 2017-05-27 MED ORDER — PROPOFOL 10 MG/ML IV BOLUS
INTRAVENOUS | Status: DC | PRN
  Administered 2017-05-27: 40 mg via INTRAVENOUS
  Administered 2017-05-27: 150 mg via INTRAVENOUS

## 2017-05-27 MED ORDER — EPHEDRINE SULFATE 50 MG/ML IJ SOLN
INTRAMUSCULAR | Status: DC | PRN
  Administered 2017-05-27 (×2): 5 mg via INTRAVENOUS
  Administered 2017-05-27: 15 mg via INTRAVENOUS
  Administered 2017-05-27: 10 mg via INTRAVENOUS

## 2017-05-27 MED ORDER — METOPROLOL TARTRATE 5 MG/5ML IV SOLN
5.0000 mg | Freq: Once | INTRAVENOUS | Status: AC
  Administered 2017-05-27: 5 mg via INTRAVENOUS
  Filled 2017-05-27: qty 5

## 2017-05-27 MED ORDER — DILTIAZEM HCL 60 MG PO TABS
60.0000 mg | ORAL_TABLET | Freq: Three times a day (TID) | ORAL | Status: DC
Start: 2017-05-27 — End: 2017-05-30
  Administered 2017-05-27 – 2017-05-30 (×8): 60 mg via ORAL
  Filled 2017-05-27 (×8): qty 1

## 2017-05-27 SURGICAL SUPPLY — 63 items
ARMBAND PINK RESTRICT EXTREMIT (MISCELLANEOUS) ×6 IMPLANT
BAG DECANTER FOR FLEXI CONT (MISCELLANEOUS) ×3 IMPLANT
BIOPATCH RED 1 DISK 7.0 (GAUZE/BANDAGES/DRESSINGS) ×3 IMPLANT
CANISTER SUCT 3000ML PPV (MISCELLANEOUS) ×3 IMPLANT
CANNULA VESSEL 3MM 2 BLNT TIP (CANNULA) ×3 IMPLANT
CATH PALINDROME RT-P 15FX19CM (CATHETERS) IMPLANT
CATH PALINDROME RT-P 15FX23CM (CATHETERS) ×3 IMPLANT
CATH PALINDROME RT-P 15FX28CM (CATHETERS) IMPLANT
CATH PALINDROME RT-P 15FX55CM (CATHETERS) IMPLANT
CATH STRAIGHT 5FR 65CM (CATHETERS) IMPLANT
CHLORAPREP W/TINT 26ML (MISCELLANEOUS) ×3 IMPLANT
CLIP VESOCCLUDE MED 6/CT (CLIP) ×3 IMPLANT
CLIP VESOCCLUDE SM WIDE 6/CT (CLIP) ×3 IMPLANT
COVER PROBE W GEL 5X96 (DRAPES) IMPLANT
COVER SURGICAL LIGHT HANDLE (MISCELLANEOUS) ×3 IMPLANT
DECANTER SPIKE VIAL GLASS SM (MISCELLANEOUS) ×3 IMPLANT
DERMABOND ADVANCED (GAUZE/BANDAGES/DRESSINGS) ×2
DERMABOND ADVANCED .7 DNX12 (GAUZE/BANDAGES/DRESSINGS) ×4 IMPLANT
DRAIN PENROSE 1/4X12 LTX STRL (WOUND CARE) ×3 IMPLANT
DRAPE C-ARM 42X72 X-RAY (DRAPES) ×3 IMPLANT
DRAPE CHEST BREAST 15X10 FENES (DRAPES) ×3 IMPLANT
DRSG COVADERM 4X6 (GAUZE/BANDAGES/DRESSINGS) ×3 IMPLANT
ELECT REM PT RETURN 9FT ADLT (ELECTROSURGICAL) ×3
ELECTRODE REM PT RTRN 9FT ADLT (ELECTROSURGICAL) ×2 IMPLANT
GAUZE SPONGE 4X4 16PLY XRAY LF (GAUZE/BANDAGES/DRESSINGS) ×3 IMPLANT
GLOVE BIO SURGEON STRL SZ 6.5 (GLOVE) ×18 IMPLANT
GLOVE BIO SURGEON STRL SZ7.5 (GLOVE) ×3 IMPLANT
GLOVE BIOGEL PI IND STRL 6.5 (GLOVE) ×8 IMPLANT
GLOVE BIOGEL PI IND STRL 7.5 (GLOVE) ×2 IMPLANT
GLOVE BIOGEL PI INDICATOR 6.5 (GLOVE) ×4
GLOVE BIOGEL PI INDICATOR 7.5 (GLOVE) ×1
GLOVE ECLIPSE 7.0 STRL STRAW (GLOVE) ×3 IMPLANT
GLOVE SKINSENSE NS SZ6.5 (GLOVE) ×2
GLOVE SKINSENSE STRL SZ6.5 (GLOVE) ×4 IMPLANT
GOWN STRL REUS W/ TWL LRG LVL3 (GOWN DISPOSABLE) ×12 IMPLANT
GOWN STRL REUS W/TWL LRG LVL3 (GOWN DISPOSABLE) ×6
KIT BASIN OR (CUSTOM PROCEDURE TRAY) ×3 IMPLANT
KIT ROOM TURNOVER OR (KITS) ×3 IMPLANT
LOOP VESSEL MINI RED (MISCELLANEOUS) IMPLANT
NEEDLE 18GX1X1/2 (RX/OR ONLY) (NEEDLE) ×3 IMPLANT
NEEDLE HYPO 25GX1X1/2 BEV (NEEDLE) ×3 IMPLANT
NS IRRIG 1000ML POUR BTL (IV SOLUTION) ×3 IMPLANT
PACK CV ACCESS (CUSTOM PROCEDURE TRAY) ×3 IMPLANT
PACK SURGICAL SETUP 50X90 (CUSTOM PROCEDURE TRAY) ×3 IMPLANT
PAD ARMBOARD 7.5X6 YLW CONV (MISCELLANEOUS) ×6 IMPLANT
SET MICROPUNCTURE 5F STIFF (MISCELLANEOUS) IMPLANT
SPONGE SURGIFOAM ABS GEL 100 (HEMOSTASIS) IMPLANT
SUT ETHILON 3 0 PS 1 (SUTURE) ×3 IMPLANT
SUT PROLENE 6 0 BV (SUTURE) ×3 IMPLANT
SUT PROLENE 6 0 CC (SUTURE) ×6 IMPLANT
SUT PROLENE 7 0 BV 1 (SUTURE) ×3 IMPLANT
SUT VIC AB 3-0 SH 27 (SUTURE) ×1
SUT VIC AB 3-0 SH 27X BRD (SUTURE) ×2 IMPLANT
SUT VICRYL 4-0 PS2 18IN ABS (SUTURE) ×3 IMPLANT
SYR 10ML LL (SYRINGE) ×3 IMPLANT
SYR 20CC LL (SYRINGE) ×6 IMPLANT
SYR 5ML LL (SYRINGE) ×3 IMPLANT
SYR CONTROL 10ML LL (SYRINGE) ×3 IMPLANT
TOWEL GREEN STERILE (TOWEL DISPOSABLE) ×6 IMPLANT
TOWEL GREEN STERILE FF (TOWEL DISPOSABLE) ×3 IMPLANT
UNDERPAD 30X30 (UNDERPADS AND DIAPERS) ×3 IMPLANT
WATER STERILE IRR 1000ML POUR (IV SOLUTION) ×3 IMPLANT
WIRE AMPLATZ SS-J .035X180CM (WIRE) IMPLANT

## 2017-05-27 NOTE — Progress Notes (Signed)
PROGRESS NOTE    Nicholas Gutierrez  YQM:[id number] DOB: 08/25/1950 DOA: 05/24/2017 PCP: Patient, No Pcp Per     Brief Narrative:  Nicholas Gutierrez is a 67 y.o.  Spanish speaking male with medical history significant for DM, CKD stage 5, glomerulosclerosis, long admission from 12/26 to [facility], then transferred to [facility] on 05/11/2017 till 05/17/2016 with acute renal failure.  At the time, he had been diagnosed with hypertensive urgency, type 2 diabetes, and nephropathy, he also was noted to have an adrenal mass, acute renal arterial thrombosis, nephrotic syndrome with hypoalbuminemia, chronic glomerulonephritis, and as mentioned CKD.  He was supposed to have a nephrologist evaluation on 06/03/2017 to discuss HD cath insertion. He now presents to the ED with progressive shortness of breath since his discharge, stating that he continues to have low amount of urine despite lasix.  He also complains of worsening lower extremity edema, which does not allow him to ambulate properly, stating the legs are too heavy.  In addition, he cannot ambulate more than from bed to the bathroom without becoming very short of breath.  He reports orthopnea as well.  He is sleeping with more than 2-3 pillows. CT chest revealed bilateral pleural effusions. Patient was admitted for further evaluation and treatment.   Assessment & Plan:   Active Problems:   Type 2 diabetes with nephropathy (HCC)   Acute diastolic CHF (congestive heart failure) (HCC)   BPH with urinary obstruction   Adrenal mass (HCC)   Chronic kidney disease (CKD), stage V (HCC)   Healthcare-associated pneumonia   Acute respiratory failure (HCC)   Acute exacerbation of CHF (congestive heart failure) (HCC)   Volume overload due to acute on chronic diastolic heart failure as well as nephrotic syndrome/CKD stage V -BNP 1253 on admission  -Nephrology consulted, lasix 160mg  IV every 6 hours, zoroxolyn 5mg  BID  -Vascular surgery to evaluate for [facility] and  AV fistula   - per renal will need HD this admission  ?HCAP  -CT chest: Patchy consolidations throughout both lungs suspicious for pneumonia -Thought to be more consistent with fluid. Antibiotics stopped.   Hypertension  -Continue coreg, imdur, hydralazine    Type II Diabetes  -HA1C 6.2 -SSI  Incidental adrenal mass -Follow up outpatient   new onset atrial flutter/atrial fibrillation last night with a heart rate of 110 to 130 Initiated on Cardizem drip last night Rhythm consult by EKG Overnight patient has converted back to normal sinus rhythm Suspect this is partly due to volume overload, and will improve with dialysis Will start patient on scheduled oral Cardizem to maintain sinus rhythm   DVT prophylaxis: subq hep Code Status: full Family Communication: at bedside Disposition Plan: pending vascular surgery procedure, dialysis initiation    Consultants:   Nephrology  Vascular surgery   Procedures:   None   Antimicrobials:  Anti-infectives (From admission, onward)   Start     Dose/Rate Route Frequency Ordered Stop   05/27/17 1030  ceFAZolin (ANCEF) IVPB 1 g/50 mL premix     1 g 100 mL/hr over 30 Minutes Intravenous  Once 05/27/17 1024 05/27/17 1054   05/27/17 1026  ceFAZolin (ANCEF) 1-4 GM/50ML-% IVPB    Comments:  Block, Sarah   : cabinet override      05/27/17 1026 05/27/17 1054   05/25/17 1630  ceFEPIme (MAXIPIME) 500 mg in dextrose 5 % 50 mL IVPB  Status:  Discontinued     500 mg 100 mL/hr over 30 Minutes Intravenous Every 24 hours 05/24/17  1730 05/25/17 1649   05/24/17 1600  vancomycin (VANCOCIN) 2,000 mg in sodium chloride 0.9 % 500 mL IVPB     2,000 mg 250 mL/hr over 120 Minutes Intravenous  Once 05/24/17 1551 05/24/17 1958   05/24/17 1545  ceFEPIme (MAXIPIME) 1 g in dextrose 5 % 50 mL IVPB     1 g 100 mL/hr over 30 Minutes Intravenous  Once 05/24/17 1536 05/24/17 1713       Subjective: Patient seen with family at bedside, using [employer]  interpreter. States his breathing is slightly better but swelling persists   Objective: Vitals:   05/27/17 0747 05/27/17 0830 05/27/17 1251 05/27/17 1304  BP:  (!) 141/72 111/67 125/75  Pulse:  63 63 66  Resp:   16 17  Temp: 98.2 F (36.8 C)  (!) 97.4 F (36.3 C)   TempSrc: Oral     SpO2:   93% 94%  Weight:      Height:        Intake/Output Summary (Last 24 hours) at 05/27/2017 1306 Last data filed at 05/27/2017 1246 Gross per 24 hour  Intake 827.5 ml  Output 1745 ml  Net -917.5 ml   Filed Weights   05/25/17 0508 05/26/17 0423 05/27/17 0652  Weight: 104.3 kg (229 lb 14.4 oz) 102.2 kg (225 lb 6.4 oz) 99.4 kg (219 lb 1.6 oz)    Examination:  General exam: Appears calm and comfortable  Respiratory system: Diminished breath sounds bilateral bases  Cardiovascular system: S1 & S2 heard, RRR. No JVD, murmurs, rubs, gallops or clicks. +2 pedal edema. Gastrointestinal system: Abdomen is soft and nontender. No organomegaly or masses felt. Normal bowel sounds heard. Central nervous system: Alert and oriented. No focal neurological deficits. Extremities: Symmetric Skin: No rashes, lesions or ulcers Psychiatry: Judgement and insight appear normal. Mood & affect appropriate.   Data Reviewed: I have personally reviewed following labs and imaging studies  CBC: Recent Labs  Lab 05/24/17 1154 05/25/17 0839 05/26/17 0658  WBC 6.9 6.0 6.4  HGB 11.7* 12.3* 11.5*  HCT 35.7* 39.3 36.3*  MCV 96.5 98.0 97.3  PLT 204 240 216   Basic Metabolic Panel: Recent Labs  Lab 05/24/17 1154 05/25/17 0839 05/26/17 0658 05/26/17 1427 05/27/17 0409  NA 136 138 139 137 137  K 5.3* 5.3* 4.7 4.6 4.1  CL 107 108 107 105 104  CO2 18* 19* 21* 21* 22  GLUCOSE 157* 122* 107* 167* 126*  BUN 90* 86* 87* 87* 90*  CREATININE 5.58* 5.35* 5.54* 5.68* 5.80*  CALCIUM 8.1* 8.4* 8.3* 8.0* 8.1*  MG  --   --   --   --  2.9*   GFR: Estimated Creatinine Clearance: 13.2 mL/min (A) (by C-G formula based on SCr  of 5.8 mg/dL (H)). Liver Function Tests: Recent Labs  Lab 05/24/17 1605  AST 18  ALT 16*  ALKPHOS 105  BILITOT 0.7  PROT 6.1*  ALBUMIN 2.4*   Recent Labs  Lab 05/24/17 1605  LIPASE 27   No results for input(s): AMMONIA in the last 168 hours. Coagulation Profile: No results for input(s): INR, PROTIME in the last 168 hours. Cardiac Enzymes: Recent Labs  Lab 05/26/17 1427 05/27/17 0409  TROPONINI <0.03 <0.03   BNP (last 3 results) No results for input(s): PROBNP in the last 8760 hours. HbA1C: Recent Labs    05/24/17 1736  HGBA1C 6.2*   CBG: Recent Labs  Lab 05/26/17 2215 05/27/17 0133 05/27/17 0529 05/27/17 0743 05/27/17 1256  GLUCAP 161* 141* 113*  124* 117*   Lipid Profile: No results for input(s): CHOL, HDL, LDLCALC, TRIG, CHOLHDL, LDLDIRECT in the last 72 hours. Thyroid Function Tests: Recent Labs    05/24/17 1736  TSH 3.877   Anemia Panel: Recent Labs    05/25/17 1842  FERRITIN 52  TIBC 253  IRON 36*   Sepsis Labs: No results for input(s): PROCALCITON, LATICACIDVEN in the last 168 hours.  Recent Results (from the past 240 hour(s))  Urine culture     Status: None   Collection Time: 05/24/17  3:33 PM  Result Value Ref Range Status   Specimen Description URINE, CLEAN CATCH  Final   Special Requests NONE  Final   Culture NO GROWTH  Final   Report Status 05/25/2017 FINAL  Final  Blood culture (routine x 2)     Status: None (Preliminary result)   Collection Time: 05/24/17  4:15 PM  Result Value Ref Range Status   Specimen Description BLOOD RIGHT ANTECUBITAL  Final   Special Requests   Final    BOTTLES DRAWN AEROBIC AND ANAEROBIC Blood Culture results may not be optimal due to an excessive volume of blood received in culture bottles   Culture NO GROWTH 3 DAYS  Final   Report Status PENDING  Incomplete  Blood culture (routine x 2)     Status: None (Preliminary result)   Collection Time: 05/24/17  4:20 PM  Result Value Ref Range Status    Specimen Description BLOOD LEFT ANTECUBITAL  Final   Special Requests   Final    BOTTLES DRAWN AEROBIC AND ANAEROBIC Blood Culture results may not be optimal due to an excessive volume of blood received in culture bottles   Culture NO GROWTH 3 DAYS  Final   Report Status PENDING  Incomplete  MRSA PCR Screening     Status: None   Collection Time: 05/24/17  9:56 PM  Result Value Ref Range Status   MRSA by PCR NEGATIVE NEGATIVE Final    Comment:        The GeneXpert MRSA Assay (FDA approved for NASAL specimens only), is one component of a comprehensive MRSA colonization surveillance program. It is not intended to diagnose MRSA infection nor to guide or monitor treatment for MRSA infections.   Surgical PCR screen     Status: None   Collection Time: 05/27/17  1:01 AM  Result Value Ref Range Status   MRSA, PCR NEGATIVE NEGATIVE Final   Staphylococcus aureus NEGATIVE NEGATIVE Final    Comment: (NOTE) The Xpert SA Assay (FDA approved for NASAL specimens in patients 65 years of age and older), is one component of a comprehensive surveillance program. It is not intended to diagnose infection nor to guide or monitor treatment.        Radiology Studies: Dg Fluoro Guide Cv Line-no Report  Result Date: 05/27/2017 Fluoroscopy was utilized by the requesting physician.  No radiographic interpretation.      Scheduled Meds: . [MAR Hold] carvedilol  12.5 mg Oral BID WC  . [MAR Hold] heparin  5,000 Units Subcutaneous Q8H  . [MAR Hold] hydrALAZINE  100 mg Oral Q8H  . [MAR Hold] insulin aspart  0-9 Units Subcutaneous Q4H  . [MAR Hold] isosorbide mononitrate  90 mg Oral Daily  . [MAR Hold] mouth rinse  15 mL Mouth Rinse BID  . [MAR Hold] metolazone  5 mg Oral BID   Continuous Infusions: . [MAR Hold] sodium chloride    . [MAR Hold] sodium chloride    . diltiazem (CARDIZEM)  infusion Stopped (05/27/17 0720)  . [MAR Hold] furosemide 160 mg (05/27/17 0524)     LOS: 3 days    Time  spent: 30 minutes   Richarda Overlie, DO [facility] [email or web address] Password [email or web address] 05/27/2017, 1:06 PM

## 2017-05-27 NOTE — Progress Notes (Addendum)
ANTICOAGULATION CONSULT NOTE  Pharmacy Consult for warfarin Indication: atrial fibrillation  No Known Allergies  Patient Measurements: Height: 5\' 2"  (157.5 cm) Weight: 219 lb 1.6 oz (99.4 kg) IBW/kg (Calculated) : 54.6  Vital Signs: Temp: 97.8 F (36.6 C) (01/15 1357) Temp Source: Oral (01/15 1357) BP: 123/72 (01/15 1616) Pulse Rate: 63 (01/15 1350)  Labs: Recent Labs    05/25/17 0839 05/26/17 0658 05/26/17 1427 05/27/17 0409 05/27/17 1345  HGB 12.3* 11.5*  --   --   --   HCT 39.3 36.3*  --   --   --   PLT 240 216  --   --   --   CREATININE 5.35* 5.54* 5.68* 5.80* 5.71*  TROPONINI  --   --  <0.03 <0.03 <0.03    Estimated Creatinine Clearance: 13.4 mL/min (A) (by C-G formula based on SCr of 5.71 mg/dL (H)).  Assessment: Patient with new onset AFib to start warfarin. Not on anticoagulation PTA. Hgb and plts stable- no bleeding noted.  Patient is on subq heparin - will continue until INR is therapeutic.  Goal of Therapy:  INR 2-3 Monitor platelets by anticoagulation protocol: Yes   Plan:  Warfarin 5mg  po x1 tonight Daily INR Follow s/s bleeding Will provide education prior to discharge  Frankie Scipio D. Warnell Rasnic, PharmD, BCPS Clinical Pharmacist [phone] 05/27/2017 5:02 PM

## 2017-05-27 NOTE — Progress Notes (Signed)
Turned off cardizem gtt per MD Abrol. Sinus rhythm 61, BP 117/73 (87). RR 13. Recent blood sugar 124. Patient denies any complaints, will continue to monitor.

## 2017-05-27 NOTE — Progress Notes (Signed)
  [facility] Progress Note   Assessment/ Plan:   1. 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  s/p TDC and LUE AVF.  For HD #1 this evening.  CLIP initiated. 2. DM2 - on insulin 3. HTN - worse due to vol overload, cont coreg/ hydral and adding diuretics 4. Pulm edema - off abx    Subjective:    S/p TDC and LUE AVF.  For HD #1 this evening.     Objective:   BP 123/72 (BP Location: Right Arm)   Pulse 63   Temp 98 F (36.7 C) (Oral)   Resp 10   Ht 5\' 2"  (1.575 m)   Wt 99.4 kg (219 lb 1.6 oz)   SpO2 96%   BMI 40.07 kg/m   Physical Exam: Gen: older gentleman, NAD CVS:RRR with+ S3 Resp: wearing O2, bilateral crackles Abd: mildly distended Ext:+2 LE edema  Labs: BMET Recent Labs  Lab 05/24/17 1154 05/25/17 0839 05/26/17 0658 05/26/17 1427 05/27/17 0409 05/27/17 1345  NA 136 138 139 137 137 138  K 5.3* 5.3* 4.7 4.6 4.1 4.2  CL 107 108 107 105 104 103  CO2 18* 19* 21* 21* 22 23  GLUCOSE 157* 122* 107* 167* 126* 121*  BUN 90* 86* 87* 87* 90* 87*  CREATININE 5.58* 5.35* 5.54* 5.68* 5.80* 5.71*  CALCIUM 8.1* 8.4* 8.3* 8.0* 8.1* 8.2*   CBC Recent Labs  Lab 05/24/17 1154 05/25/17 0839 05/26/17 0658  WBC 6.9 6.0 6.4  HGB 11.7* 12.3* 11.5*  HCT 35.7* 39.3 36.3*  MCV 96.5 98.0 97.3  PLT 204 240 216    @IMGRELPRIORS @ Medications:    . carvedilol  12.5 mg Oral BID WC  . diltiazem  60 mg Oral Q8H  . heparin  5,000 Units Subcutaneous Q8H  . hydrALAZINE  100 mg Oral Q8H  . insulin aspart  0-9 Units Subcutaneous Q4H  . isosorbide mononitrate  90 mg Oral Daily  . mouth rinse  15 mL Mouth Rinse BID  . metolazone  5 mg Oral BID  . warfarin  5 mg Oral ONCE-1800  . Warfarin - Pharmacist Dosing Inpatient   Does not apply q1800     Bufford ButtnerElizabeth Sheika Coutts, MD [facility] pgr [phone] 05/27/2017, 5:27 PM

## 2017-05-27 NOTE — Op Note (Signed)
Procedure: Ultrasound-guided insertion of Palidrome catheter right internal jugular vein, left brachial cephalic AV fistula  Preoperative diagnosis: End-stage renal disease  Postoperative diagnosis: Same  Anesthesia: General  Asst: Lianne Cure, PA-C, Doreatha Massed PA-C Operative findings: 23 cm Palindrome catheter right internal jugular vein  3.5 mm left cephalic vein  Operative details: After obtaining informed consent, the patient was taken to the operating room. The patient was placed in supine position on the operating room table. After commencing general anesthesia, the patient's entire neck and chest were prepped and draped in usual sterile fashion. The patient was placed in Trendelenburg position. Ultrasound was used to identify the patient's right internal jugular vein. This had normal compressibility and respiratory variation. Local anesthesia was infiltrated over the right jugular vein.  Using ultrasound guidance, the right internal jugular vein was successfully cannulated.  A 0.035 J-tipped guidewire was threaded into the right internal jugular vein and into the superior vena cava followed by the inferior vena cava under fluoroscopic guidance.   Next sequential 12 and 14 dilators were placed over the guidewire into the right atrium.  A 16 French dilator with a peel-away sheath was then placed over the guidewire into the right atrium.   The guidewire and dilator were removed. A 23 cm Palindrome catheter was then placed through the peel away sheath into the right atrium.  The catheter was then tunneled subcutaneously, cut to length, and the hub attached. The catheter was noted to flush and draw easily. The catheter was inspected under fluoroscopy and found with its tip to be in the right atrium without any kinks throughout its course. The catheter was sutured to the skin with nylon sutures. The neck insertion site was closed with Vicryl stitch. The catheter was then loaded with  concentrated Heparin solution. A dry sterile dressing was applied.  Next, the left upper extremity was prepped and draped in usual sterile fashion.  A transverse incision was then made near the antecubital crease the left arm. The incision was carried into the subcutaneous tissues down to level of the cephalic vein. The cephalic vein was approximately 3.5 mm in diameter. It was of good quality. This was dissected free circumferentially and small side branches ligated and divided between silk ties or clips. Next the brachial artery was dissected free in the medial portion of the incision. The artery was  3-4 mm in diameter. The vessel loops were placed proximal and distal to the planned site of arteriotomy. The patient was given 5000 units of intravenous heparin. After appropriate circulation time, the vessel loops were used to control the artery. A longitudinal opening was made in the brachial artery.  The vein was ligated distally with a 2-0 silk tie. The vein was controlled proximally with a fine bulldog clamp. The vein was then swung over to the artery and sewn end of vein to side of artery using a running 7-0 Prolene suture. Just prior to completion of the anastomosis, everything was fore bled back bled and thoroughly flushed. The anastomosis was secured, vessel loops released, and there was a palpable thrill in the fistula immediately. After hemostasis was obtained, the subcutaneous tissues were reapproximated using a running 3-0 Vicryl suture. The skin was then closed with a 4 Vicryl subcuticular stitch. Dermabond was applied to the skin incision.  The patient did not have a palpable radial pulse prior to the procedure.  He had biphasic radial doppler flow at the end of the procedure.  The patient tolerated procedure well and there  were no complications. Instrument sponge and needle counts correct in the case. The patient was taken to the recovery room in stable condition. Chest x-ray will be obtained in  the recovery room.  Fabienne Brunsharles Fields, MD [facility] Office: [phone] Pager: [phone]

## 2017-05-27 NOTE — Anesthesia Preprocedure Evaluation (Addendum)
Anesthesia Evaluation  Patient identified by MRN, date of birth, ID band Patient awake    Reviewed: Allergy & Precautions, NPO status , Patient's Chart, lab work & pertinent test results  Airway Mallampati: III  TM Distance: >3 FB     Dental  (+) Teeth Intact, Dental Advisory Given,    Pulmonary    breath sounds clear to auscultation + decreased breath sounds      Cardiovascular  Rhythm:Regular Rate:Normal     Neuro/Psych    GI/Hepatic   Endo/Other  diabetes  Renal/GU      Musculoskeletal   Abdominal (+) + obese,   Peds  Hematology   Anesthesia Other Findings   Reproductive/Obstetrics                            Anesthesia Physical Anesthesia Plan  ASA: III  Anesthesia Plan: General   Post-op Pain Management:    Induction: Intravenous  PONV Risk Score and Plan: Ondansetron and Midazolam  Airway Management Planned: LMA  Additional Equipment:   Intra-op Plan:   Post-operative Plan:   Informed Consent: I have reviewed the patients History and Physical, chart, labs and discussed the procedure including the risks, benefits and alternatives for the proposed anesthesia with the patient or authorized representative who has indicated his/her understanding and acceptance.   Dental advisory given  Plan Discussed with: CRNA and Anesthesiologist  Anesthesia Plan Comments:        Anesthesia Quick Evaluation

## 2017-05-27 NOTE — Anesthesia Postprocedure Evaluation (Signed)
Anesthesia Post Note  Patient: Nicholas Gutierrez  Procedure(s) Performed: INSERTION OF Right Internal Jugular TUNNELED DIALYSIS CATHETER (Right Neck) Creation of Left Brachiocephalic arteriovenous Fistula (Left Arm Upper)     Patient location during evaluation: PACU Anesthesia Type: General Level of consciousness: awake and alert Pain management: pain level controlled Vital Signs Assessment: post-procedure vital signs reviewed and stable Respiratory status: spontaneous breathing, nonlabored ventilation, respiratory function stable and patient connected to nasal cannula oxygen Cardiovascular status: blood pressure returned to baseline and stable Postop Assessment: no apparent nausea or vomiting Anesthetic complications: no    Last Vitals:  Vitals:   05/27/17 1616 05/27/17 1700  BP: 123/72   Pulse:    Resp:    Temp:  36.7 C  SpO2: 96%     Last Pain:  Vitals:   05/27/17 1700  TempSrc: Oral  PainSc:                  Aizah Gehlhausen COKER

## 2017-05-27 NOTE — Interval H&P Note (Signed)
History and Physical Interval Note:  05/27/2017 10:17 AM  Nicholas Gutierrez  has presented today for surgery, with the diagnosis of END STAGE RENAL DISEASE FOR HEMODIALYSIS ACCESS  The various methods of treatment have been discussed with the patient and family. After consideration of risks, benefits and other options for treatment, the patient has consented to  Procedure(s): INSERTION OF TUNNELED DIALYSIS CATHETER (N/A) ARTERIOVENOUS (AV) FISTULA CREATION VERSUS GRAFT LEFT ARM (Left) as a surgical intervention .  The patient's history has been reviewed, patient examined, no change in status, stable for surgery.  I have reviewed the patient's chart and labs.  Questions were answered to the patient's satisfaction.     Fabienne Brunsharles Deyona Soza

## 2017-05-27 NOTE — Progress Notes (Signed)
PT Cancellation Note  Patient Details Name: Wende BushyLuis Westley MRN: [id number] DOB: [birth year]   Cancelled Treatment:    Reason Eval/Treat Not Completed: Patient declined, no reason specified .  Pt begged off today due to a lot of pain after HD catheter placement today. 05/27/2017  Zeb BingKen Dontario Evetts, PT [phone]  (pager)  Eliseo GumKenneth V Dontasia Miranda 05/27/2017, 4:35 PM

## 2017-05-27 NOTE — Progress Notes (Addendum)
Paged [facility]. HR remains in 110s-130s after 5mg  lopressor x 2. Patient now complaining of back pain and chest pain 8/10. BP 122/80  Orders to start cardizem drip. Aspirin given and troponins to be drawn.

## 2017-05-27 NOTE — Progress Notes (Signed)
OT Cancellation Note  Patient Details Name: Wende BushyLuis Ribas MRN: [id number] DOB: October 13, 1950   Cancelled Treatment:    Reason Eval/Treat Not Completed: Patient at procedure or test/ unavailable. Pt off unit for procedure. Will check back as able to progress with plan of care.   Doristine Sectionharity A Presli Fanguy, MS OTR/L  Pager: [phone]   Anysa Tacey A Karlton Maya 05/27/2017, 8:45 AM

## 2017-05-27 NOTE — Transfer of Care (Signed)
Immediate Anesthesia Transfer of Care Note  Patient: Wende BushyLuis Justice  Procedure(s) Performed: INSERTION OF Right Internal Jugular TUNNELED DIALYSIS CATHETER (Right Neck) Creation of Left Brachiocephalic arteriovenous Fistula (Left Arm Upper)  Patient Location: PACU  Anesthesia Type:General  Level of Consciousness: awake, alert  and oriented  Airway & Oxygen Therapy: Patient Spontanous Breathing and Patient connected to nasal cannula oxygen  Post-op Assessment: Report given to RN, Post -op Vital signs reviewed and stable and Patient moving all extremities X 4  Post vital signs: Reviewed and stable  Last Vitals:  Vitals:   05/27/17 0830 05/27/17 1251  BP: (!) 141/72   Pulse: 63   Resp:    Temp:  (!) 36.3 C  SpO2:  93%    Last Pain:  Vitals:   05/27/17 1251  TempSrc:   PainSc: Asleep         Complications: No apparent anesthesia complications

## 2017-05-27 NOTE — Anesthesia Procedure Notes (Signed)
Procedure Name: LMA Insertion Date/Time: 05/27/2017 10:59 AM Performed by: Marena ChancyBeckner, Coben Godshall S, CRNA Pre-anesthesia Checklist: Patient identified, Emergency Drugs available, Suction available, Patient being monitored and Timeout performed Patient Re-evaluated:Patient Re-evaluated prior to induction Oxygen Delivery Method: Circle system utilized Preoxygenation: Pre-oxygenation with 100% oxygen Induction Type: IV induction LMA: LMA inserted LMA Size: 4.0 Tube secured with: Tape Dental Injury: Teeth and Oropharynx as per pre-operative assessment

## 2017-05-28 ENCOUNTER — Telehealth: Payer: Self-pay | Admitting: Vascular Surgery

## 2017-05-28 ENCOUNTER — Encounter (HOSPITAL_COMMUNITY): Payer: Self-pay | Admitting: Vascular Surgery

## 2017-05-28 DIAGNOSIS — I5043 Acute on chronic combined systolic (congestive) and diastolic (congestive) heart failure: Secondary | ICD-10-CM

## 2017-05-28 LAB — GLUCOSE, CAPILLARY
GLUCOSE-CAPILLARY: 126 mg/dL — AB (ref 65–99)
GLUCOSE-CAPILLARY: 142 mg/dL — AB (ref 65–99)
GLUCOSE-CAPILLARY: 148 mg/dL — AB (ref 65–99)
Glucose-Capillary: 138 mg/dL — ABNORMAL HIGH (ref 65–99)
Glucose-Capillary: 118 mg/dL — ABNORMAL HIGH (ref 65–99)
Glucose-Capillary: 132 mg/dL — ABNORMAL HIGH (ref 65–99)

## 2017-05-28 LAB — PROTIME-INR
INR: 1
Prothrombin Time: 13.1 seconds (ref 11.4–15.2)

## 2017-05-28 MED ORDER — PNEUMOCOCCAL VAC POLYVALENT 25 MCG/0.5ML IJ INJ
0.5000 mL | INJECTION | INTRAMUSCULAR | Status: AC
Start: 2017-05-29 — End: 2017-05-30
  Administered 2017-05-30: 0.5 mL via INTRAMUSCULAR
  Filled 2017-05-28: qty 0.5

## 2017-05-28 MED ORDER — INFLUENZA VAC SPLIT QUAD 0.5 ML IM SUSY
0.5000 mL | PREFILLED_SYRINGE | INTRAMUSCULAR | Status: AC
  Administered 2017-05-30: 0.5 mL via INTRAMUSCULAR
  Filled 2017-05-28: qty 0.5

## 2017-05-28 MED ORDER — WARFARIN SODIUM 7.5 MG PO TABS
7.5000 mg | ORAL_TABLET | Freq: Once | ORAL | Status: AC
  Administered 2017-05-28: 7.5 mg via ORAL
  Filled 2017-05-28: qty 1

## 2017-05-28 MED ORDER — WARFARIN VIDEO
Freq: Once | Status: AC
  Administered 2017-05-28: 16:00:00

## 2017-05-28 MED ORDER — ONDANSETRON HCL 4 MG/2ML IJ SOLN: 4.0000 mg | Freq: Four times a day (QID) | INTRAMUSCULAR | Status: DC | PRN

## 2017-05-28 MED ORDER — SODIUM CHLORIDE 0.9 % IV SOLN
125.0000 mg | INTRAVENOUS | Status: DC
  Administered 2017-05-28 – 2017-05-30 (×2): 125 mg via INTRAVENOUS
  Filled 2017-05-28 (×3): qty 10

## 2017-05-28 MED ORDER — CALCIUM ACETATE (PHOS BINDER) 667 MG PO CAPS
667.0000 mg | ORAL_CAPSULE | Freq: Three times a day (TID) | ORAL | Status: DC
  Administered 2017-05-28 – 2017-05-29 (×4): 667 mg via ORAL
  Filled 2017-05-28 (×4): qty 1

## 2017-05-28 NOTE — Progress Notes (Signed)
OT Cancellation Note  Patient Details Name: Nicholas Gutierrez MRN: [id number] DOB: August 21, 1950   Cancelled Treatment:    Reason Eval/Treat Not Completed: Patient declined, no reason specified;Other (comment): Pt declined participation in OT session this afternoon secondary to nausea/vomiting and dizziness along with significant fatigue. Utilized video interpreter, Nicholas Gutierrez [phone] to assist with communication. Will check back in am for continuation of plan of care.   [name], MS OTR/L  Pager: [phone]   [name] 05/28/2017, 5:09 PM

## 2017-05-28 NOTE — Telephone Encounter (Signed)
-----   Message from Sharee PimpleMarilyn K McChesney, RN sent at 05/27/2017  1:06 PM EST ----- Regarding: 4-6 weeks w/ duplex   ----- Message ----- From: Dara Lordshyne, Samantha J, PA-C Sent: 05/27/2017  12:47 PM To: Vvs Charge Pool  S/p left BC AVF and TDC placement.  F/u with Dr. Darrick PennaFields in 4-6 weeks with duplex.  Thanks

## 2017-05-28 NOTE — Progress Notes (Signed)
  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  s/p TDC and LUE AVF.  HD #1 today.  Tolerating well today. 2. DM2 - on insulin 3. HTN - worse due to vol overload, cont coreg/ hydral and imdur 4. New Afib: on PO dilt and warfarin started 5. Anemia: Hgb 11.5, no ESA yet,  Iron sat 14%, starting IV iron. 6. BMD: PTH 118 12/31, Phos 5.7.  Will start Ca acetate with meals. 7. Dispo: CLIP in process    Subjective:    First dialysis this AM- rolled over from last night.  Reports a little nausea.     Objective:   BP 110/66 (BP Location: Right Arm)   Pulse (!) 58   Temp (!) 97.5 F (36.4 C) (Oral)   Resp 12   Ht 5\' 2"  (1.575 m)   Wt 99.7 kg (219 lb 14.4 oz)   SpO2 96%   BMI 40.22 kg/m   Physical Exam: Gen: older gentleman, NAD CVS:RRR, S3 improved Resp: wearing O2, bilateral crackles improving Abd: mildly distended Ext:+2 LE edema  Labs: BMET Recent Labs  Lab 05/24/17 1154 05/25/17 0839 05/26/17 0658 05/26/17 1427 05/27/17 0409 05/27/17 1345  NA 136 138 139 137 137 138  K 5.3* 5.3* 4.7 4.6 4.1 4.2  CL 107 108 107 105 104 103  CO2 18* 19* 21* 21* 22 23  GLUCOSE 157* 122* 107* 167* 126* 121*  BUN 90* 86* 87* 87* 90* 87*  CREATININE 5.58* 5.35* 5.54* 5.68* 5.80* 5.71*  CALCIUM 8.1* 8.4* 8.3* 8.0* 8.1* 8.2*   CBC Recent Labs  Lab 05/24/17 1154 05/25/17 0839 05/26/17 0658  WBC 6.9 6.0 6.4  HGB 11.7* 12.3* 11.5*  HCT 35.7* 39.3 36.3*  MCV 96.5 98.0 97.3  PLT 204 240 216    @IMGRELPRIORS @ Medications:    . carvedilol  12.5 mg Oral BID WC  . diltiazem  60 mg Oral Q8H  . heparin  5,000 Units Subcutaneous Q8H  . hydrALAZINE  100 mg Oral Q8H  . insulin aspart  0-9 Units Subcutaneous Q4H  . isosorbide mononitrate  90 mg Oral Daily  . mouth rinse  15 mL Mouth Rinse BID  . metolazone  5 mg Oral BID  .  Warfarin - Pharmacist Dosing Inpatient   Does not apply q1800     Bufford ButtnerElizabeth Kaijah Abts, MD [facility] pgr [phone] 05/28/2017, 7:50 AM

## 2017-05-28 NOTE — Telephone Encounter (Signed)
Schd appt 07/10/17; lab at 11:00 and MD at 11:45. Spoke to pt's wife to give appt info.

## 2017-05-28 NOTE — Progress Notes (Signed)
Physical Therapy Treatment Patient Details Name: Nicholas Gutierrez MRN: [id number] DOB: 1951/03/23 Today's Date: 05/28/2017    History of Present Illness Pt is a 67 y.o. male with recent medical history significant for DM, CKD stage 5,and history of glomerulosclerosis. He had a long admission from 12/26 to [facility], then transferred to [facility] on 05/11/2017 till 05/17/2016 with acute renal failure.  At the time, he had been diagnosed with hypertensive urgency, type 2 diabetes, and nephropathy, he also was noted to have an adrenal mass, acute renal arterial thrombosis, nephrotic syndrome with hypoalbuminemia, chronic glomerulonephritis, and as mentioned CKD.  He was supposed to have a nephrologist evaluation on 06/03/2017 to discuss HD cath insertion  He presented to the ED with progressive shortness of breath since his discharge.     PT Comments    Steady with gait, sats hold in the 91-93% range on RA, all after returning from HD.   Follow Up Recommendations  Home health PT     Equipment Recommendations  None recommended by PT    Recommendations for Other Services       Precautions / Restrictions Precautions Precaution Comments: watch sats    Mobility  Bed Mobility Overal bed mobility: Needs Assistance Bed Mobility: Supine to Sit     Supine to sit: Supervision     General bed mobility comments: no assist needed, just extra time  Transfers Overall transfer level: Needs assistance Equipment used: None(vs iv pole) Transfers: Sit to/from Stand Sit to Stand: Min guard            Ambulation/Gait Ambulation/Gait assistance: Min guard Ambulation Distance (Feet): 120 Feet Assistive device: (iv pole) Gait Pattern/deviations: Step-through pattern Gait velocity: decreased Gait velocity interpretation: Below normal speed for age/gender General Gait Details: steady with iv pole ambulating in hall.  SpO2 maintiained in 91-93% range with HR in upper 80's.  pt report  dizziness and a little nausea.   Stairs            Wheelchair Mobility    Modified Rankin (Stroke Patients Only)       Balance     Sitting balance-Leahy Scale: Good       Standing balance-Leahy Scale: Fair                              Cognition Arousal/Alertness: Awake/alert Behavior During Therapy: WFL for tasks assessed/performed Overall Cognitive Status: Within Functional Limits for tasks assessed                                        Exercises      General Comments        Pertinent Vitals/Pain Pain Assessment: Faces Faces Pain Scale: Hurts a little bit Pain Location: arm Pain Descriptors / Indicators: Grimacing Pain Intervention(s): Monitored during session    Home Living                      Prior Function            PT Goals (current goals can now be found in the care plan section) Acute Rehab PT Goals Patient Stated Goal: get stronger PT Goal Formulation: With patient/family Time For Goal Achievement: 06/08/17 Potential to Achieve Goals: Good Progress towards PT goals: Progressing toward goals    Frequency    Min 3X/week  PT Plan Current plan remains appropriate    Co-evaluation              AM-PAC PT "6 Clicks" Daily Activity  Outcome Measure  Difficulty turning over in bed (including adjusting bedclothes, sheets and blankets)?: None Difficulty moving from lying on back to sitting on the side of the bed? : A Little Difficulty sitting down on and standing up from a chair with arms (e.g., wheelchair, bedside commode, etc,.)?: A Little Help needed moving to and from a bed to chair (including a wheelchair)?: None Help needed walking in hospital room?: A Little Help needed climbing 3-5 steps with a railing? : A Little 6 Click Score: 20    End of Session   Activity Tolerance: Patient limited by fatigue;Patient tolerated treatment well Patient left: in chair;with call bell/phone  within reach;with family/visitor present Nurse Communication: Mobility status PT Visit Diagnosis: Unsteadiness on feet (R26.81);Difficulty in walking, not elsewhere classified (R26.2);Muscle weakness (generalized) (M62.81)     Time: 1610-9604 PT Time Calculation (min) (ACUTE ONLY): 15 min  Charges:  $Gait Training: 8-22 mins                    G Codes:       06/03/17  Texhoma Bing, PT [phone]  (pager)   Nicholas Gutierrez 2017-06-03, 12:22 PM

## 2017-05-28 NOTE — Progress Notes (Signed)
ANTICOAGULATION CONSULT NOTE - Follow Up Consult  Pharmacy Consult for Coumadin Indication: atrial fibrillation  No Known Allergies  Patient Measurements: Height: 5\' 2"  (157.5 cm) Weight: 220 lb 7.4 oz (100 kg) IBW/kg (Calculated) : 54.6  Vital Signs: Temp: 98.2 F (36.8 C) (01/16 1141) Temp Source: Oral (01/16 1141) BP: 153/75 (01/16 1141) Pulse Rate: 71 (01/16 1141)  Labs: Recent Labs    05/26/17 0658 05/26/17 1427 05/27/17 0409 05/27/17 1345 05/28/17 0539  HGB 11.5*  --   --   --   --   HCT 36.3*  --   --   --   --   PLT 216  --   --   --   --   LABPROT  --   --   --   --  13.1  INR  --   --   --   --  1.00  CREATININE 5.54* 5.68* 5.80* 5.71*  --   TROPONINI  --  <0.03 <0.03 <0.03  --     Estimated Creatinine Clearance: 13.5 mL/min (A) (by C-G formula based on SCr of 5.71 mg/dL (H)).  Assessment:  67 yr old male with new onset atrial fibrillation.  Started on Coumadin last night.  INR at baseline after Coumadin 5 mg x 1 dose.  Goal of Therapy:  INR 2-3 Monitor platelets by anticoagulation protocol: Yes   Plan:   Coumadin 7.5 mg x 1 today.  Daily PT/INR.  Continue heparin 5000 units sq q8hrs.  Will provide written Coumadin info in Spanish; education video is available in [place].  Dennie Fettersgan, Jaala Bohle Donovan, [place] Pager: [phone] 05/28/2017,12:32 PM

## 2017-05-28 NOTE — Plan of Care (Signed)
Pt to begin HD today.  This will be his first treatment.  Family at bedside and very supportive.  AV fistula placed in left arm and they are aware this will need about 12 weeks to mature.  He speaks very limited [place] and daughters at bedside have been helping with interpreting.

## 2017-05-28 NOTE — Procedures (Signed)
Patient seen and examined on Hemodialysis. QB 200 mL/ min, UF goal 1L net.  HD #1.  Tolerating well.  Treatment adjusted as needed.  Bufford ButtnerElizabeth Elihue Ebert MD [facility] pgr [phone] 8:01 AM

## 2017-05-28 NOTE — Progress Notes (Addendum)
[facility]  Subjective  - Doing well over.     Objective (!) 145/76 63 97.7 F (36.5 C) (Oral) 13 100%  Intake/Output Summary (Last 24 hours) at 05/28/2017 1110 Last data filed at 05/28/2017 0921 Gross per 24 hour  Intake 1096 ml  Output 2470 ml  Net -1374 ml    Left anti cubital incision healing well, minimal incisional edema. Palpable anastomosis thrill Left hand with palpable radial pulse Tingling/decreased sensation in the fifth digit left hand. Right TDC intact with out bleeding  Assessment/Planning: POD # 1 left BC av fistula and placement of right TDC  [facility] until we have successful maturation of the left AV fistula.  He will f/u in our office for fistula duplex to exam the fistula's maturation. He has no true signs or symptoms of steal.  The fifth digit numbness does not indicate tru steal.    F/U in our office in 6-7 weeks with Dr. Florentina AddisonFiellds  Emma Maureen Collins 05/28/2017 11:10 AM -- Agree with above.  Fistula patent.  Some numbness in finger maybe mild steal.  Will follow up in a few weeks  Fabienne Brunsharles Khush Pasion, MD [facility] [place] Office: [phone] Pager: [phone]  Laboratory Lab Results: Recent Labs    05/26/17 0658  WBC 6.4  HGB 11.5*  HCT 36.3*  PLT 216   BMET Recent Labs    05/27/17 0409 05/27/17 1345  NA 137 138  K 4.1 4.2  CL 104 103  CO2 22 23  GLUCOSE 126* 121*  BUN 90* 87*  CREATININE 5.80* 5.71*  CALCIUM 8.1* 8.2*    COAG Lab Results  Component Value Date   INR 1.00 05/28/2017   INR 0.97 05/13/2017   No results found for: PTT

## 2017-05-28 NOTE — Progress Notes (Signed)
PROGRESS NOTE    Nicholas Gutierrez  UJW:[id number] DOB: [birth year] DOA: 05/24/2017 PCP: Patient, No Pcp Per     Brief Narrative:  Nicholas Gutierrez is a 67 y.o.  Spanish speaking male with medical history significant for DM, CKD stage 5, glomerulosclerosis, long admission [facility] 12/[facility], then [facility] to [facility] on [facility]/11/2017 till 05/17/2016 with acute renal failure.  At the time, he had been diagnosed with hypertensive urgency, type 2 diabetes, and nephropathy, he also was noted to have an adrenal mass, acute renal arterial thrombosis, nephrotic syndrome with hypoalbuminemia, chronic glomerulonephritis, and as mentioned CKD.  He was supposed to have a nephrologist evaluation on 06/03/2017 to discuss HD cath insertion. He now presents to the ED with progressive shortness of breath since his discharge, stating that he continues to have low amount of urine despite lasix.  He also complains of worsening lower extremity edema, which does not allow him to ambulate properly, stating the legs are too heavy.  In addition, he cannot ambulate more than from bed to the bathroom without becoming very short of breath.  He reports orthopnea as well.  He is sleeping with more than 2-3 pillows. CT chest revealed bilateral pleural effusions. Patient was admitted for further evaluation and treatment.   Assessment & Plan:   Active Problems:   Type 2 diabetes with nephropathy (HCC)   Acute diastolic CHF (congestive heart failure) (HCC)   BPH with urinary obstruction   Adrenal mass (HCC)   Chronic kidney disease (CKD), stage V (HCC)   Healthcare-associated pneumonia   Acute respiratory failure (HCC)   Acute exacerbation of CHF (congestive heart failure) (HCC)   Volume overload due to acute on chronic diastolic heart failure as well as nephrotic syndrome/CKD stage V -BNP 1253 on admission  -Nephrology consulted, initially managed volume with lasix 160mg  IV every 6 hours, zoroxolyn 5mg  BID . Now started  on HD -Vascular surgery -status post TDC and left upper extremity AV fistula   - Started on HD, day #2 Started clip process  ?HCAP  -CT chest: Patchy consolidations throughout both lungs suspicious for pneumonia -Thought to be more consistent with fluid. Antibiotics stopped.   Hypertension  -Continue coreg, imdur, hydralazine    Type II Diabetes  -HA1C 6.2 -SSI  Incidental adrenal mass -Follow up outpatient   new onset paroxysmal atrial flutter/atrial fibrillation 1/14-1/15 with a heart rate of 110 to 130 Initiated on Cardizem drip briefly on 1/15 Overnight patient has converted back to normal sinus rhythm Switched to PO cardizem Suspect this is partly due to volume overload, and will improve with dialysis  2-D echo shows EF of  55-60% with grade 2 diastolic dysfunction. Pressure of 62 mm TSH 3.87  DVT prophylaxis: subq hep Code Status: full Family Communication: at bedside Disposition Plan: pending CLIP   Consultants:   Nephrology  Vascular surgery   Procedures:   None   Antimicrobials:  Anti-infectives (From admission, onward)   Start     Dose/Rate Route Frequency Ordered Stop   05/27/17 1030  ceFAZolin (ANCEF) IVPB 1 g/50 mL premix     1 g 100 mL/hr over 30 Minutes Intravenous  Once 05/27/17 1024 05/27/17 1054   05/27/17 1026  ceFAZolin (ANCEF) 1-4 GM/50ML-% IVPB    Comments:  Block, Sarah   : cabinet override      05/27/17 1026 05/27/17 1054   05/25/17 1630  ceFEPIme (MAXIPIME) 500 mg in dextrose 5 % 50 mL IVPB  Status:  Discontinued  500 mg 100 mL/hr over 30 Minutes Intravenous Every 24 hours 05/24/17 1730 05/25/17 1649   05/24/17 1600  vancomycin (VANCOCIN) 2,000 mg in sodium chloride 0.9 % 500 mL IVPB     2,000 mg 250 mL/hr over 120 Minutes Intravenous  Once 05/24/17 1551 05/24/17 1958   05/24/17 1545  ceFEPIme (MAXIPIME) 1 g in dextrose 5 % 50 mL IVPB     1 g 100 mL/hr over 30 Minutes Intravenous  Once 05/24/17 1536 05/24/17 1713        Subjective:  Complained of soreness in the right antecubital fossa, secondary to blood pressure cuff. Shortness of breath has improved. Patient was nauseous earlier today improved after he had lunch  Objective: Vitals:   05/28/17 0900 05/28/17 0921 05/28/17 1137 05/28/17 1141  BP:  (!) 145/76 (!) 153/75 (!) 153/75  Pulse: 64 63  71  Resp: 12 13  16   Temp:  97.7 F (36.5 C)  98.2 F (36.8 C)  TempSrc:  Oral  Oral  SpO2:  100%  96%  Weight:      Height:        Intake/Output Summary (Last 24 hours) at 05/28/2017 1249 Last data filed at 05/28/2017 1100 Gross per 24 hour  Intake 796 ml  Output 3600 ml  Net -2804 ml   Filed Weights   05/27/17 0652 05/28/17 0435 05/28/17 0710  Weight: 99.4 kg (219 lb 1.6 oz) 99.7 kg (219 lb 14.4 oz) 100 kg (220 lb 7.4 oz)    Examination:  General exam: Appears calm and comfortable  Respiratory system: Diminished breath sounds bilateral bases  Cardiovascular system: S1 & S2 heard, RRR. No JVD, murmurs, rubs, gallops or clicks. +2 pedal edema. Gastrointestinal system: Abdomen is soft and nontender. No organomegaly or masses felt. Normal bowel sounds heard. Central nervous system: Alert and oriented. No focal neurological deficits. Extremities: AVF LUE  Skin: No rashes, lesions or ulcers Psychiatry: Judgement and insight appear normal. Mood & affect appropriate.   Data Reviewed: I have personally reviewed following labs and imaging studies  CBC: Recent Labs  Lab 05/24/17 1154 05/25/17 0839 05/26/17 0658  WBC 6.9 6.0 6.4  HGB 11.7* 12.3* 11.5*  HCT 35.7* 39.3 36.3*  MCV 96.5 98.0 97.3  PLT 204 240 216   Basic Metabolic Panel: Recent Labs  Lab 05/25/17 0839 05/26/17 0658 05/26/17 1427 05/27/17 0409 05/27/17 1345  NA 138 139 137 137 138  K 5.3* 4.7 4.6 4.1 4.2  CL 108 107 105 104 103  CO2 19* 21* 21* 22 23  GLUCOSE 122* 107* 167* 126* 121*  BUN 86* 87* 87* 90* 87*  CREATININE 5.35* 5.54* 5.68* 5.80* 5.71*  CALCIUM 8.4*  8.3* 8.0* 8.1* 8.2*  MG  --   --   --  2.9*  --    GFR: Estimated Creatinine Clearance: 13.5 mL/min (A) (by C-G formula based on SCr of 5.71 mg/dL (H)). Liver Function Tests: Recent Labs  Lab 05/24/17 1605  AST 18  ALT 16*  ALKPHOS 105  BILITOT 0.7  PROT 6.1*  ALBUMIN 2.4*   Recent Labs  Lab 05/24/17 1605  LIPASE 27   No results for input(s): AMMONIA in the last 168 hours. Coagulation Profile: Recent Labs  Lab 05/28/17 0539  INR 1.00   Cardiac Enzymes: Recent Labs  Lab 05/26/17 1427 05/27/17 0409 05/27/17 1345  TROPONINI <0.03 <0.03 <0.03   BNP (last 3 results) No results for input(s): PROBNP in the last 8760 hours. HbA1C: No results for input(s):  HGBA1C in the last 72 hours. CBG: Recent Labs  Lab 05/27/17 2025 05/28/17 0030 05/28/17 0430 05/28/17 0647 05/28/17 1137  GLUCAP 136* 148* 126* 138* 118*   Lipid Profile: No results for input(s): CHOL, HDL, LDLCALC, TRIG, CHOLHDL, LDLDIRECT in the last 72 hours. Thyroid Function Tests: No results for input(s): TSH, T4TOTAL, FREET4, T3FREE, THYROIDAB in the last 72 hours. Anemia Panel: Recent Labs    05/25/17 1842  FERRITIN 52  TIBC 253  IRON 36*   Sepsis Labs: No results for input(s): PROCALCITON, LATICACIDVEN in the last 168 hours.  Recent Results (from the past 240 hour(s))  Urine culture     Status: None   Collection Time: 05/24/17  3:33 PM  Result Value Ref Range Status   Specimen Description URINE, CLEAN CATCH  Final   Special Requests NONE  Final   Culture NO GROWTH  Final   Report Status 05/25/2017 FINAL  Final  Blood culture (routine x 2)     Status: None (Preliminary result)   Collection Time: 05/24/17  4:15 PM  Result Value Ref Range Status   Specimen Description BLOOD RIGHT ANTECUBITAL  Final   Special Requests   Final    BOTTLES DRAWN AEROBIC AND ANAEROBIC Blood Culture results may not be optimal due to an excessive volume of blood received in culture bottles   Culture NO GROWTH 3  DAYS  Final   Report Status PENDING  Incomplete  Blood culture (routine x 2)     Status: None (Preliminary result)   Collection Time: 05/24/17  4:20 PM  Result Value Ref Range Status   Specimen Description BLOOD LEFT ANTECUBITAL  Final   Special Requests   Final    BOTTLES DRAWN AEROBIC AND ANAEROBIC Blood Culture results may not be optimal due to an excessive volume of blood received in culture bottles   Culture NO GROWTH 3 DAYS  Final   Report Status PENDING  Incomplete  MRSA PCR Screening     Status: None   Collection Time: 05/24/17  9:56 PM  Result Value Ref Range Status   MRSA by PCR NEGATIVE NEGATIVE Final    Comment:        The GeneXpert MRSA Assay (FDA approved for NASAL specimens only), is one component of a comprehensive MRSA colonization surveillance program. It is not intended to diagnose MRSA infection nor to guide or monitor treatment for MRSA infections.   Surgical PCR screen     Status: None   Collection Time: 05/27/17  1:01 AM  Result Value Ref Range Status   MRSA, PCR NEGATIVE NEGATIVE Final   Staphylococcus aureus NEGATIVE NEGATIVE Final    Comment: (NOTE) The Xpert SA Assay (FDA approved for NASAL specimens in patients 33 years of age and older), is one component of a comprehensive surveillance program. It is not intended to diagnose infection nor to guide or monitor treatment.        Radiology Studies: Dg Chest Port 1 View  Result Date: 05/27/2017 CLINICAL DATA:  Status post dialysis catheter placement. EXAM: PORTABLE CHEST 1 VIEW COMPARISON:  Radiograph of May 25, 2017. FINDINGS: Stable cardiomediastinal silhouette. Stable mild central pulmonary vascular congestion is noted with possible bilateral pulmonary edema. Mild left pleural effusion is again noted and stable. Interval placement of right internal jugular catheter with distal tip in expected position of cavoatrial junction. No pneumothorax is noted. Bony thorax is unremarkable. IMPRESSION:  Stable central pulmonary vascular congestion is noted with possible bilateral pulmonary edema. Stable mild left pleural  effusion. Interval placement of right internal jugular dialysis catheter with distal tip in expected position of cavoatrial junction. Electronically Signed   By: Lupita Raider, M.D.   On: 05/27/2017 13:08   Dg Fluoro Guide Cv Line-no Report  Result Date: 05/27/2017 Fluoroscopy was utilized by the requesting physician.  No radiographic interpretation.      Scheduled Meds: . calcium acetate  667 mg Oral TID WC  . carvedilol  12.5 mg Oral BID WC  . diltiazem  60 mg Oral Q8H  . heparin  5,000 Units Subcutaneous Q8H  . hydrALAZINE  100 mg Oral Q8H  . insulin aspart  0-9 Units Subcutaneous Q4H  . isosorbide mononitrate  90 mg Oral Daily  . mouth rinse  15 mL Mouth Rinse BID  . warfarin  7.5 mg Oral ONCE-1800  . warfarin   Does not apply Once  . Warfarin - Pharmacist Dosing Inpatient   Does not apply q1800   Continuous Infusions: . sodium chloride    . sodium chloride    . diltiazem (CARDIZEM) infusion Stopped (05/27/17 0720)  . ferric gluconate (FERRLECIT/NULECIT) IV 125 mg (05/28/17 1141)     LOS: 4 days    Time spent: 30 minutes   Richarda Overlie, DO [facility] [email or web address] Password [facility] 05/28/2017, 12:49 PM

## 2017-05-29 ENCOUNTER — Other Ambulatory Visit: Payer: Self-pay

## 2017-05-29 DIAGNOSIS — N185 Chronic kidney disease, stage 5: Secondary | ICD-10-CM

## 2017-05-29 DIAGNOSIS — Z48812 Encounter for surgical aftercare following surgery on the circulatory system: Secondary | ICD-10-CM

## 2017-05-29 LAB — CBC
HEMATOCRIT: 34.9 % — AB (ref 39.0–52.0)
HEMOGLOBIN: 11.5 g/dL — AB (ref 13.0–17.0)
MCH: 31.7 pg (ref 26.0–34.0)
MCHC: 33 g/dL (ref 30.0–36.0)
MCV: 96.1 fL (ref 78.0–100.0)
Platelets: 188 10*3/uL (ref 150–400)
RBC: 3.63 MIL/uL — AB (ref 4.22–5.81)
RDW: 13 % (ref 11.5–15.5)
WBC: 5.5 10*3/uL (ref 4.0–10.5)

## 2017-05-29 LAB — GLUCOSE, CAPILLARY
GLUCOSE-CAPILLARY: 129 mg/dL — AB (ref 65–99)
GLUCOSE-CAPILLARY: 110 mg/dL — AB (ref 65–99)
Glucose-Capillary: 131 mg/dL — ABNORMAL HIGH (ref 65–99)
Glucose-Capillary: 162 mg/dL — ABNORMAL HIGH (ref 65–99)
Glucose-Capillary: 175 mg/dL — ABNORMAL HIGH (ref 65–99)

## 2017-05-29 LAB — PROTIME-INR
INR: 1.88
Prothrombin Time: 21.5 seconds — ABNORMAL HIGH (ref 11.4–15.2)

## 2017-05-29 LAB — CULTURE, BLOOD (ROUTINE X 2)
Culture: NO GROWTH
Culture: NO GROWTH

## 2017-05-29 LAB — RENAL FUNCTION PANEL
ALBUMIN: 2.4 g/dL — AB (ref 3.5–5.0)
ANION GAP: 12 (ref 5–15)
BUN: 61 mg/dL — ABNORMAL HIGH (ref 6–20)
CO2: 26 mmol/L (ref 22–32)
Calcium: 8.3 mg/dL — ABNORMAL LOW (ref 8.9–10.3)
Chloride: 98 mmol/L — ABNORMAL LOW (ref 101–111)
Creatinine, Ser: 5.12 mg/dL — ABNORMAL HIGH (ref 0.61–1.24)
GFR, EST AFRICAN AMERICAN: 12 mL/min — AB (ref >60)
GFR, EST NON AFRICAN AMERICAN: 11 mL/min — AB (ref >60)
Glucose, Bld: 124 mg/dL — ABNORMAL HIGH (ref 65–99)
PHOSPHORUS: 6.1 mg/dL — AB (ref 2.5–4.6)
POTASSIUM: 3.8 mmol/L (ref 3.5–5.1)
Sodium: 136 mmol/L (ref 135–145)

## 2017-05-29 MED ORDER — WARFARIN SODIUM 2.5 MG PO TABS
2.5000 mg | ORAL_TABLET | Freq: Once | ORAL | Status: AC
  Administered 2017-05-29: 2.5 mg via ORAL
  Filled 2017-05-29: qty 1

## 2017-05-29 NOTE — Progress Notes (Signed)
OT Cancellation Note  Patient Details Name: Nicholas BushyLuis Gutierrez MRN: [id number] DOB: [birth year]   Cancelled Treatment:    Reason Eval/Treat Not Completed: Patient at procedure or test/ unavailable(HD). Will continue to follow.  Evern BioMayberry, Carry Weesner Lynn 05/29/2017, 8:04 AM  05/29/2017 Martie RoundJulie Lateshia Schmoker, OTR/L Pager: [phone]

## 2017-05-29 NOTE — Progress Notes (Signed)
  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  s/p TDC and LUE AVF.  HD started 05/28/17. Tolerating well today. 2. DM2 - on insulin 3. HTN - worse due to vol overload, cont coreg/ hydral and imdur 4. New Afib: on PO dilt and warfarin started 5. Anemia: Hgb 11.5, no ESA yet,  Iron sat 14%, starting IV iron. 6. BMD: PTH 118 12/31, Phos 5.7.  Will start Ca acetate with meals. 7. Dispo: CLIP in process    Subjective:    HD #2 today.  Tolerating well.   Objective:   BP (!) 152/77   Pulse 62   Temp 98.2 F (36.8 C) (Oral)   Resp 14   Ht 5\' 2"  (1.575 m)   Wt 96.1 kg (211 lb 13.8 oz)   SpO2 95%   BMI 38.75 kg/m   Physical Exam: Gen: older gentleman, NAD CVS:RRR, no S3 today Resp: wearing O2, bilateral crackles improved Abd: distention improved Ext: edema improving ACCESS: TDC in place and LUE AVF + T/B  Labs: BMET Recent Labs  Lab 05/24/17 1154 05/25/17 0839 05/26/17 0658 05/26/17 1427 05/27/17 0409 05/27/17 1345 05/29/17 0801  NA 136 138 139 137 137 138 136  K 5.3* 5.3* 4.7 4.6 4.1 4.2 3.8  CL 107 108 107 105 104 103 98*  CO2 18* 19* 21* 21* 22 23 26   GLUCOSE 157* 122* 107* 167* 126* 121* 124*  BUN 90* 86* 87* 87* 90* 87* 61*  CREATININE 5.58* 5.35* 5.54* 5.68* 5.80* 5.71* 5.12*  CALCIUM 8.1* 8.4* 8.3* 8.0* 8.1* 8.2* 8.3*  PHOS  --   --   --   --   --   --  6.1*   CBC Recent Labs  Lab 05/24/17 1154 05/25/17 0839 05/26/17 0658 05/29/17 0801  WBC 6.9 6.0 6.4 5.5  HGB 11.7* 12.3* 11.5* 11.5*  HCT 35.7* 39.3 36.3* 34.9*  MCV 96.5 98.0 97.3 96.1  PLT 204 240 216 188    @IMGRELPRIORS @ Medications:    . calcium acetate  667 mg Oral TID WC  . carvedilol  12.5 mg Oral BID WC  . diltiazem  60 mg Oral Q8H  . heparin  5,000 Units Subcutaneous Q8H  . hydrALAZINE  100 mg Oral Q8H  . Influenza vac  split quadrivalent PF  0.5 mL Intramuscular Tomorrow-1000  . insulin aspart  0-9 Units Subcutaneous Q4H  . isosorbide mononitrate  90 mg Oral Daily  . mouth rinse  15 mL Mouth Rinse BID  . pneumococcal 23 valent vaccine  0.5 mL Intramuscular Tomorrow-1000  . Warfarin - Pharmacist Dosing Inpatient   Does not apply q1800     Bufford ButtnerElizabeth Jay Kempe, MD [facility] pgr [phone] 05/29/2017, 12:23 PM

## 2017-05-29 NOTE — Procedures (Signed)
Patient seen and examined on Hemodialysis. QB 300 mL/ min, UF goal 500 mL Treatment adjusted as needed.  Bufford ButtnerElizabeth Sharonlee Nine MD [facility] pgr [phone] 12:25 PM

## 2017-05-29 NOTE — Progress Notes (Signed)
PROGRESS NOTE    Nicholas BushyLuis Meldrum  FAO:[id number] DOB: [birth year] DOA: 05/24/2017 PCP: Patient, No Pcp Per     Brief Narrative:  Nicholas Gutierrez is a 67 y.o.  Spanish speaking male with medical history significant for DM, CKD stage 5, glomerulosclerosis, long admission [facility] 12/[facility] to [facility], then [facility] till 05/17/2016 with acute renal failure.  At the time, he had been diagnosed with hypertensive urgency, type 2 diabetes, and nephropathy, he also was noted to have an adrenal mass, acute renal arterial thrombosis, nephrotic syndrome with hypoalbuminemia, chronic glomerulonephritis, and as mentioned CKD.  He was supposed to have a nephrologist evaluation on 06/03/2017 to discuss HD cath insertion. He now presents to the ED with progressive shortness of breath since his discharge, stating that he continues to have low amount of urine despite lasix.  He also complains of worsening lower extremity edema, which does not allow him to ambulate properly, stating the legs are too heavy.  In addition, he cannot ambulate more than from bed to the bathroom without becoming very short of breath.  He reports orthopnea as well.  He is sleeping with more than 2-3 pillows. CT chest revealed bilateral pleural effusions. Patient was admitted for further evaluation and treatment.   Assessment & Plan:   Active Problems:   Type 2 diabetes with nephropathy (HCC)   Acute diastolic CHF (congestive heart failure) (HCC)   BPH with urinary obstruction   Adrenal mass (HCC)   Chronic kidney disease (CKD), stage V (HCC)   Healthcare-associated pneumonia   Acute respiratory failure (HCC)   Acute exacerbation of CHF (congestive heart failure) (HCC)   Shortness of breath secondary to acute on chronic diastolic heart failure as well as nephrotic syndrome/CKD stage  /ESRD -BNP 1253 on admission  -Nephrology consulted, initially managed volume with lasix 160mg  IV every 6 hours, zoroxolyn 5mg  BID  . Now started on HD, day #3 -Vascular surgery -status post TDC and left upper extremity AV fistula   Started clip process  ?HCAP  -CT chest: Patchy consolidations throughout both lungs suspicious for pneumonia -Thought to be more consistent with fluid. Antibiotics stopped.   Hypertension  -Continue coreg, imdur, hydralazine    Type II Diabetes  -HA1C 6.2 -SSI Patient also on NPH 70/30 at home which can be resumed at discharge  Incidental adrenal mass -Follow up outpatient   new onset paroxysmal atrial flutter/atrial fibrillation 1/14-1/15 with a heart rate of 110 to 130 Initiated on Cardizem drip briefly on 1/15 Overnight patient converted back to normal sinus rhythm Switched to PO cardizem Suspect this is partly due to volume overload, and will improve with dialysis  2-D echo shows EF of  55-60% with grade 2 diastolic dysfunction. Pressure of 62 mm TSH 3.87 Now on Coumadin for anticoagulation  Hypertension Continue Coreg, Cardizem, hydralazine, Imdur,  DVT prophylaxis: subq hep Code Status: full Family Communication: at bedside Disposition Plan: pending CLIP   Consultants:   Nephrology  Vascular surgery   Procedures:   None   Antimicrobials:  Anti-infectives (From admission, onward)   Start     Dose/Rate Route Frequency Ordered Stop   05/27/17 1030  ceFAZolin (ANCEF) IVPB 1 g/50 mL premix     1 g 100 mL/hr over 30 Minutes Intravenous  Once 05/27/17 1024 05/27/17 1054   05/27/17 1026  ceFAZolin (ANCEF) 1-4 GM/50ML-% IVPB    Comments:  Block, Sarah   : cabinet override      05/27/17 1026 05/27/17 1054  05/25/17 1630  ceFEPIme (MAXIPIME) 500 mg in dextrose 5 % 50 mL IVPB  Status:  Discontinued     500 mg 100 mL/hr over 30 Minutes Intravenous Every 24 hours 05/24/17 1730 05/25/17 1649   05/24/17 1600  vancomycin (VANCOCIN) 2,000 mg in sodium chloride 0.9 % 500 mL IVPB     2,000 mg 250 mL/hr over 120 Minutes Intravenous  Once 05/24/17 1551 05/24/17 1958    05/24/17 1545  ceFEPIme (MAXIPIME) 1 g in dextrose 5 % 50 mL IVPB     1 g 100 mL/hr over 30 Minutes Intravenous  Once 05/24/17 1536 05/24/17 1713       Subjective:  Shortness of breath has improved, patient denies any nausea today, no episodes of vomiting overnight  Objective: Vitals:   05/29/17 0730 05/29/17 0800 05/29/17 0830 05/29/17 0900  BP: 139/77 (!) 145/81 (!) 147/80 (!) 151/83  Pulse: 68 68 67 67  Resp: 13 14 13 13   Temp:      TempSrc:      SpO2:      Weight:      Height:        Intake/Output Summary (Last 24 hours) at 05/29/2017 0925 Last data filed at 05/29/2017 0121 Gross per 24 hour  Intake 340 ml  Output 1400 ml  Net -1060 ml   Filed Weights   05/28/17 0921 05/29/17 0426 05/29/17 0708  Weight: 99.1 kg (218 lb 7.6 oz) 96.8 kg (213 lb 6.5 oz) 97.3 kg (214 lb 8.1 oz)    Examination:  General exam: Appears calm and comfortable  Respiratory system: Diminished breath sounds bilateral bases  Cardiovascular system: S1 & S2 heard, RRR. No JVD, murmurs, rubs, gallops or clicks. +2 pedal edema. Gastrointestinal system: Abdomen is soft and nontender. No organomegaly or masses felt. Normal bowel sounds heard. Central nervous system: Alert and oriented. No focal neurological deficits. Extremities: AVF LUE  Skin: No rashes, lesions or ulcers Psychiatry: Judgement and insight appear normal. Mood & affect appropriate.   Data Reviewed: I have personally reviewed following labs and imaging studies  CBC: Recent Labs  Lab 05/24/17 1154 05/25/17 0839 05/26/17 0658 05/29/17 0801  WBC 6.9 6.0 6.4 5.5  HGB 11.7* 12.3* 11.5* 11.5*  HCT 35.7* 39.3 36.3* 34.9*  MCV 96.5 98.0 97.3 96.1  PLT 204 240 216 188   Basic Metabolic Panel: Recent Labs  Lab 05/26/17 0658 05/26/17 1427 05/27/17 0409 05/27/17 1345 05/29/17 0801  NA 139 137 137 138 136  K 4.7 4.6 4.1 4.2 3.8  CL 107 105 104 103 98*  CO2 21* 21* 22 23 26   GLUCOSE 107* 167* 126* 121* 124*  BUN 87* 87* 90* 87*  61*  CREATININE 5.54* 5.68* 5.80* 5.71* 5.12*  CALCIUM 8.3* 8.0* 8.1* 8.2* 8.3*  MG  --   --  2.9*  --   --   PHOS  --   --   --   --  6.1*   GFR: Estimated Creatinine Clearance: 14.8 mL/min (A) (by C-G formula based on SCr of 5.12 mg/dL (H)). Liver Function Tests: Recent Labs  Lab 05/24/17 1605 05/29/17 0801  AST 18  --   ALT 16*  --   ALKPHOS 105  --   BILITOT 0.7  --   PROT 6.1*  --   ALBUMIN 2.4* 2.4*   Recent Labs  Lab 05/24/17 1605  LIPASE 27   No results for input(s): AMMONIA in the last 168 hours. Coagulation Profile: Recent Labs  Lab 05/28/17 0539  INR 1.00   Cardiac Enzymes: Recent Labs  Lab 05/26/17 1427 05/27/17 0409 05/27/17 1345  TROPONINI <0.03 <0.03 <0.03   BNP (last 3 results) No results for input(s): PROBNP in the last 8760 hours. HbA1C: No results for input(s): HGBA1C in the last 72 hours. CBG: Recent Labs  Lab 05/28/17 1137 05/28/17 1606 05/28/17 2001 05/29/17 0024 05/29/17 0418  GLUCAP 118* 142* 132* 175* 129*   Lipid Profile: No results for input(s): CHOL, HDL, LDLCALC, TRIG, CHOLHDL, LDLDIRECT in the last 72 hours. Thyroid Function Tests: No results for input(s): TSH, T4TOTAL, FREET4, T3FREE, THYROIDAB in the last 72 hours. Anemia Panel: No results for input(s): VITAMINB12, FOLATE, FERRITIN, TIBC, IRON, RETICCTPCT in the last 72 hours. Sepsis Labs: No results for input(s): PROCALCITON, LATICACIDVEN in the last 168 hours.  Recent Results (from the past 240 hour(s))  Urine culture     Status: None   Collection Time: 05/24/17  3:33 PM  Result Value Ref Range Status   Specimen Description URINE, CLEAN CATCH  Final   Special Requests NONE  Final   Culture NO GROWTH  Final   Report Status 05/25/2017 FINAL  Final  Blood culture (routine x 2)     Status: None   Collection Time: 05/24/17  4:15 PM  Result Value Ref Range Status   Specimen Description BLOOD RIGHT ANTECUBITAL  Final   Special Requests   Final    BOTTLES DRAWN  AEROBIC AND ANAEROBIC Blood Culture results may not be optimal due to an excessive volume of blood received in culture bottles   Culture NO GROWTH 5 DAYS  Final   Report Status 05/29/2017 FINAL  Final  Blood culture (routine x 2)     Status: None   Collection Time: 05/24/17  4:20 PM  Result Value Ref Range Status   Specimen Description BLOOD LEFT ANTECUBITAL  Final   Special Requests   Final    BOTTLES DRAWN AEROBIC AND ANAEROBIC Blood Culture results may not be optimal due to an excessive volume of blood received in culture bottles   Culture NO GROWTH 5 DAYS  Final   Report Status 05/29/2017 FINAL  Final  MRSA PCR Screening     Status: None   Collection Time: 05/24/17  9:56 PM  Result Value Ref Range Status   MRSA by PCR NEGATIVE NEGATIVE Final    Comment:        The GeneXpert MRSA Assay (FDA approved for NASAL specimens only), is one component of a comprehensive MRSA colonization surveillance program. It is not intended to diagnose MRSA infection nor to guide or monitor treatment for MRSA infections.   Surgical PCR screen     Status: None   Collection Time: 05/27/17  1:01 AM  Result Value Ref Range Status   MRSA, PCR NEGATIVE NEGATIVE Final   Staphylococcus aureus NEGATIVE NEGATIVE Final    Comment: (NOTE) The Xpert SA Assay (FDA approved for NASAL specimens in patients 62 years of age and older), is one component of a comprehensive surveillance program. It is not intended to diagnose infection nor to guide or monitor treatment.        Radiology Studies: Dg Chest Port 1 View  Result Date: 05/27/2017 CLINICAL DATA:  Status post dialysis catheter placement. EXAM: PORTABLE CHEST 1 VIEW COMPARISON:  Radiograph of May 25, 2017. FINDINGS: Stable cardiomediastinal silhouette. Stable mild central pulmonary vascular congestion is noted with possible bilateral pulmonary edema. Mild left pleural effusion is again noted and stable. Interval placement of right internal  jugular  catheter with distal tip in expected position of cavoatrial junction. No pneumothorax is noted. Bony thorax is unremarkable. IMPRESSION: Stable central pulmonary vascular congestion is noted with possible bilateral pulmonary edema. Stable mild left pleural effusion. Interval placement of right internal jugular dialysis catheter with distal tip in expected position of cavoatrial junction. Electronically Signed   By: Lupita Raider, M.D.   On: 05/27/2017 13:08   Dg Fluoro Guide Cv Line-no Report  Result Date: 05/27/2017 Fluoroscopy was utilized by the requesting physician.  No radiographic interpretation.      Scheduled Meds: . calcium acetate  667 mg Oral TID WC  . carvedilol  12.5 mg Oral BID WC  . diltiazem  60 mg Oral Q8H  . heparin  5,000 Units Subcutaneous Q8H  . hydrALAZINE  100 mg Oral Q8H  . Influenza vac split quadrivalent PF  0.5 mL Intramuscular Tomorrow-1000  . insulin aspart  0-9 Units Subcutaneous Q4H  . isosorbide mononitrate  90 mg Oral Daily  . mouth rinse  15 mL Mouth Rinse BID  . pneumococcal 23 valent vaccine  0.5 mL Intramuscular Tomorrow-1000  . Warfarin - Pharmacist Dosing Inpatient   Does not apply q1800   Continuous Infusions: . sodium chloride    . sodium chloride    . diltiazem (CARDIZEM) infusion Stopped (05/27/17 0720)  . ferric gluconate (FERRLECIT/NULECIT) IV Stopped (05/28/17 1242)     LOS: 5 days    Time spent: 30 minutes   Richarda Overlie, DO [facility] [email or web address] Password [email or web address] 05/29/2017, 9:25 AM

## 2017-05-29 NOTE — Progress Notes (Signed)
ANTICOAGULATION CONSULT NOTE - Follow Up Consult  Pharmacy Consult for Coumadin Indication: atrial fibrillation  No Known Allergies  Patient Measurements: Height: 5\' 2"  (157.5 cm) Weight: 211 lb 13.8 oz (96.1 kg) IBW/kg (Calculated) : 54.6  Vital Signs: Temp: 98.2 F (36.8 C) (01/17 1021) Temp Source: Oral (01/17 1021) BP: 152/77 (01/17 1055) Pulse Rate: 62 (01/17 1055)  Labs: Recent Labs    05/26/17 1427 05/27/17 0409 05/27/17 1345 05/28/17 0539 05/29/17 0801 05/29/17 1053  HGB  --   --   --   --  11.5*  --   HCT  --   --   --   --  34.9*  --   PLT  --   --   --   --  188  --   LABPROT  --   --   --  13.1  --  21.5*  INR  --   --   --  1.00  --  1.88  CREATININE 5.68* 5.80* 5.71*  --  5.12*  --   TROPONINI <0.03 <0.03 <0.03  --   --   --     Estimated Creatinine Clearance: 14.7 mL/min (A) (by C-G formula based on SCr of 5.12 mg/dL (H)).  Assessment:  67 yr old male with new onset atrial fibrillation.  Coumadin begun on 1/15.  INR was only 1.0 after Coumadin 5 mg on 1/16, up to 1.88 after 7.5 mg last night.  Bigger increase in INR than expected.  Goal of Therapy:  INR 2-3 Monitor platelets by anticoagulation protocol: Yes   Plan:   Decrease Coumadin to 2.5 mg x 1 today.  Daily PT/INR.  Continue heparin 5000 units sq q8hrs.  Coumadin info in Spanish provided on 1/16: patient and wife watched education video in [place].  Dennie Fettersgan, Daviana Haymaker [place], [place] Pager: [phone] 05/29/2017,1:03 PM

## 2017-05-29 NOTE — Care Management Note (Addendum)
Case Management Note  Patient Details  Name: Nicholas Gutierrez MRN: [id number] Date of Birth: Dec 05, 1950  Subjective/Objective: Pt presented for SOB and Edema-CKD Stage 5- Initiated on HD. Pt with HD Cath and s/p AVF. PTA from home with wife and plan will be to return home once stable. PT recommendations for [facility].                   Action/Plan: Agency List provided to patient and wife. CM was able to speak with family via Interpreter. Family wanted PT for [facility] and [facility]. Referrral given [facility] and [facility] to begin within 24-48 hours post d/c. Pt asked CM if he could do Outpatient HD- MWF with his daughter in order to have transportation to dialysis center. CM did relay information to [facility]. CLIP in process. CM will continue to monitor for disposition    Expected Discharge Date:                  Expected Discharge Plan:  Home w Home Health Services  In-House Referral:  Interpreting Services  Discharge planning Services  CM Consult  Post Acute Care Choice:  Home Health Choice offered to:  Patient, Spouse  DME Arranged:  N/A DME Agency:  NA  HH Arranged:  PT HH Agency:  [facility]  Status of Service:  Completed, signed off  If discussed at Long Length of Stay Meetings, dates discussed:    Additional Comments: 05-30-17 Tomi BambergerBrenda Graves-Bigelow, RN,BSN [phone] CLIP Process Completed- [place]. CSW [place]. [facility] to provide [facility]. No further needs from CM at this time.     [id number] 05-30-17 Tomi BambergerBrenda Graves-Bigelow, RN, BSN [phone] D[place] [facility] [place] and she [place] out of pocket. Pt has expressed interest in this transportation service as well to get to and from HD. CM will make CSW aware to assist with transportation needs for HD.  Gala LewandowskyGraves-Bigelow, Karlie Aung Kaye, RN 05/29/2017, 2:10 PM

## 2017-05-29 NOTE — Plan of Care (Signed)
  Activity: Risk for activity intolerance will decrease 05/29/2017 1532 - Progressing by Jeronimo Normaooper, Ismael Karge E, RN

## 2017-05-30 DIAGNOSIS — N185 Chronic kidney disease, stage 5: Secondary | ICD-10-CM

## 2017-05-30 DIAGNOSIS — E279 Disorder of adrenal gland, unspecified: Secondary | ICD-10-CM

## 2017-05-30 DIAGNOSIS — N401 Enlarged prostate with lower urinary tract symptoms: Secondary | ICD-10-CM

## 2017-05-30 DIAGNOSIS — I5033 Acute on chronic diastolic (congestive) heart failure: Secondary | ICD-10-CM

## 2017-05-30 DIAGNOSIS — N138 Other obstructive and reflux uropathy: Secondary | ICD-10-CM

## 2017-05-30 DIAGNOSIS — Z992 Dependence on renal dialysis: Secondary | ICD-10-CM

## 2017-05-30 DIAGNOSIS — R0602 Shortness of breath: Secondary | ICD-10-CM

## 2017-05-30 DIAGNOSIS — Z09 Encounter for follow-up examination after completed treatment for conditions other than malignant neoplasm: Secondary | ICD-10-CM

## 2017-05-30 DIAGNOSIS — J189 Pneumonia, unspecified organism: Secondary | ICD-10-CM

## 2017-05-30 DIAGNOSIS — Z95828 Presence of other vascular implants and grafts: Secondary | ICD-10-CM

## 2017-05-30 LAB — RENAL FUNCTION PANEL
ALBUMIN: 2.3 g/dL — AB (ref 3.5–5.0)
Anion gap: 10 (ref 5–15)
BUN: 32 mg/dL — ABNORMAL HIGH (ref 6–20)
CALCIUM: 8.5 mg/dL — AB (ref 8.9–10.3)
CO2: 27 mmol/L (ref 22–32)
CREATININE: 4.04 mg/dL — AB (ref 0.61–1.24)
Chloride: 100 mmol/L — ABNORMAL LOW (ref 101–111)
GFR calc Af Amer: 17 mL/min — ABNORMAL LOW (ref >60)
GFR, EST NON AFRICAN AMERICAN: 14 mL/min — AB (ref >60)
Glucose, Bld: 134 mg/dL — ABNORMAL HIGH (ref 65–99)
PHOSPHORUS: 4.3 mg/dL (ref 2.5–4.6)
POTASSIUM: 3.8 mmol/L (ref 3.5–5.1)
SODIUM: 137 mmol/L (ref 135–145)

## 2017-05-30 LAB — CBC
HEMATOCRIT: 37 % — AB (ref 39.0–52.0)
Hemoglobin: 11.7 g/dL — ABNORMAL LOW (ref 13.0–17.0)
MCH: 30.7 pg (ref 26.0–34.0)
MCHC: 31.6 g/dL (ref 30.0–36.0)
MCV: 97.1 fL (ref 78.0–100.0)
Platelets: 187 10*3/uL (ref 150–400)
RBC: 3.81 MIL/uL — ABNORMAL LOW (ref 4.22–5.81)
RDW: 13.3 % (ref 11.5–15.5)
WBC: 6.2 10*3/uL (ref 4.0–10.5)

## 2017-05-30 LAB — PROTIME-INR
INR: 2.52
PROTHROMBIN TIME: 27 s — AB (ref 11.4–15.2)

## 2017-05-30 LAB — GLUCOSE, CAPILLARY
GLUCOSE-CAPILLARY: 109 mg/dL — AB (ref 65–99)
Glucose-Capillary: 101 mg/dL — ABNORMAL HIGH (ref 65–99)
Glucose-Capillary: 117 mg/dL — ABNORMAL HIGH (ref 65–99)

## 2017-05-30 MED ORDER — DILTIAZEM HCL 60 MG PO TABS: 60.0000 mg | ORAL_TABLET | Freq: Three times a day (TID) | ORAL | 0 refills | Status: AC

## 2017-05-30 MED ORDER — WARFARIN SODIUM 2.5 MG PO TABS
2.5000 mg | ORAL_TABLET | Freq: Once | ORAL | Status: AC
  Administered 2017-05-30: 2.5 mg via ORAL
  Filled 2017-05-30: qty 1

## 2017-05-30 MED ORDER — WARFARIN SODIUM 2.5 MG PO TABS: 2.5000 mg | ORAL_TABLET | Freq: Once | ORAL | 0 refills | Status: AC

## 2017-05-30 NOTE — Progress Notes (Signed)
Accept[facility] at [facility] [address] .1st treatment Monday.January 21,2019 at 11:00am .Schedule and chairtime  Monday,Wednesday,Friday at 12:00pm .2nd shift

## 2017-05-30 NOTE — Progress Notes (Signed)
ANTICOAGULATION CONSULT NOTE - Follow Up Consult  Pharmacy Consult for Coumadin Indication: atrial fibrillation  No Known Allergies  Patient Measurements: Height: 5\' 2"  (157.5 cm) Weight: 207 lb 14.3 oz (94.3 kg) IBW/kg (Calculated) : 54.6  Vital Signs: Temp: 98.1 F (36.7 C) (01/18 1305) Temp Source: Oral (01/18 1305) BP: 154/84 (01/18 1305) Pulse Rate: 70 (01/18 1305)  Labs: Recent Labs    05/28/17 0539 05/29/17 0801 05/29/17 1053 05/30/17 0920 05/30/17 1050  HGB  --  11.5*  --  11.7*  --   HCT  --  34.9*  --  37.0*  --   PLT  --  188  --  187  --   LABPROT 13.1  --  21.5*  --  27.0*  INR 1.00  --  1.88  --  2.52  CREATININE  --  5.12*  --  4.04*  --     Assessment:  67 yr old male with new onset atrial fibrillation.  Coumadin begun on 1/15.  INR up to 2.52 after Coumadin 5 mg, then 7.5 mg then 2.5 mg over the last 3 days.  Less of an increase in INR after dose reduced on 1/17.  Goal of Therapy:  INR 2-3 Monitor platelets by anticoagulation protocol: Yes   Plan:   Repeat Coumadin to 2.5 mg today.  Daily PT/INR.  Stop heparin 5000 units sq q8hrs in am, if not discharged and INR remains >2.  If discharging today, suggest 2.5 mg daily, follow up INR with HD on 1/21.  Coumadin info in Spanish provided on 1/16: patient and wife watched education video in [place].  Dennie Fettersgan, Laylee Schooley [place], [place] Pager: [phone] 05/30/2017,2:20 PM

## 2017-05-30 NOTE — Plan of Care (Signed)
  Progressing Coping: Level of anxiety will decrease 05/30/2017 0407 - Progressing by Horris Latinouvall, Anisten Tomassi G, RN Note No s/s of anxiety noted. Pain Managment: General experience of comfort will improve 05/30/2017 0407 - Progressing by Horris Latinouvall, Isaul Landi G, RN Note No c/o pain or discomfort this shift.

## 2017-05-30 NOTE — Procedures (Signed)
Patient seen and examined on Hemodialysis. Qb 400 mL/ min via TDC, UF goal 1L.  Doing well.  Feeling better.  Treatment adjusted as needed.  Bufford ButtnerElizabeth Heike Pounds MD [facility] pgr [phone] 1:08 PM

## 2017-05-30 NOTE — Progress Notes (Addendum)
Physical Therapy Treatment Patient Details Name: Nicholas Gutierrez MRN: [id number] DOB: [birth year] Today's Date: 05/30/2017    History of Present Illness Pt is a 67 y.o. male with recent medical history significant for DM, CKD stage 5,and history of glomerulosclerosis. He had a long admission from 12/26 to [facility], then transferred to [facility] on 05/11/2017 till 05/17/2016 with acute renal failure.  At the time, he had been diagnosed with hypertensive urgency, type 2 diabetes, and nephropathy, he also was noted to have an adrenal mass, acute renal arterial thrombosis, nephrotic syndrome with hypoalbuminemia, chronic glomerulonephritis, and as mentioned CKD.  He was supposed to have a nephrologist evaluation on 06/03/2017 to discuss HD cath insertion  He presented to the ED with progressive shortness of breath since his discharge.     PT Comments    Improved stability, improved oxygenation on RA at 97%.  Still weak and mildly deconditioned and will benefit from ST HHPT.   Follow Up Recommendations  Home health PT     Equipment Recommendations  None recommended by PT    Recommendations for Other Services       Precautions / Restrictions Precautions Precaution Comments: watch sats Restrictions Weight Bearing Restrictions: No    Mobility  Bed Mobility Overal bed mobility: Needs Assistance Bed Mobility: Supine to Sit;Sit to Supine     Supine to sit: Supervision Sit to supine: Supervision   General bed mobility comments: no assist needed, just extra time  Transfers Overall transfer level: Needs assistance Equipment used: None(vs dynamap) Transfers: Sit to/from Stand Sit to Stand: Supervision         General transfer comment: safety only due to drowsy  Ambulation/Gait Ambulation/Gait assistance: Supervision Ambulation Distance (Feet): 400 Feet Assistive device: None(vs dynamap pole) Gait Pattern/deviations: Step-through pattern Gait velocity: able to increase speed  appreciably. Gait velocity interpretation: at or above normal speed for age/gender General Gait Details: generally steady without AD, but more steady with.  Should be safe in a home-llike environment and improve quickly with HHPT and family assist.   Stairs            Wheelchair Mobility    Modified Rankin (Stroke Patients Only)       Balance     Sitting balance-Leahy Scale: Good       Standing balance-Leahy Scale: Fair(to good)                              Cognition Arousal/Alertness: Awake/alert Behavior During Therapy: WFL for tasks assessed/performed Overall Cognitive Status: Within Functional Limits for tasks assessed                                        Exercises      General Comments General comments (skin integrity, edema, etc.): SpO2 on RA during gait steady at 97% with EHR in the 90's.      Pertinent Vitals/Pain Pain Assessment: Faces Faces Pain Scale: Hurts a little bit Pain Location: back of head Pain Descriptors / Indicators: Aching Pain Intervention(s): Monitored during session    Home Living                      Prior Function            PT Goals (current goals can now be found in the care plan section) Acute Rehab  PT Goals Patient Stated Goal: get stronger PT Goal Formulation: With patient/family Time For Goal Achievement: 06/08/17 Potential to Achieve Goals: Good Progress towards PT goals: Progressing toward goals    Frequency    Min 3X/week      PT Plan Current plan remains appropriate    Co-evaluation              AM-PAC PT "6 Clicks" Daily Activity  Outcome Measure  Difficulty turning over in bed (including adjusting bedclothes, sheets and blankets)?: None Difficulty moving from lying on back to sitting on the side of the bed? : None Difficulty sitting down on and standing up from a chair with arms (e.g., wheelchair, bedside commode, etc,.)?: A Little Help needed moving  to and from a bed to chair (including a wheelchair)?: A Little Help needed walking in hospital room?: A Little Help needed climbing 3-5 steps with a railing? : A Little 6 Click Score: 20    End of Session   Activity Tolerance: Patient tolerated treatment well Patient left: in bed;with call bell/phone within reach;with nursing/sitter in room;with family/visitor present Nurse Communication: Mobility status PT Visit Diagnosis: Difficulty in walking, not elsewhere classified (R26.2)     Time: [id number] PT Time Calculation (min) (ACUTE ONLY): 14 min  Charges:  $Gait Training: 8-22 mins                    G Codes:       05/30/2017  [place] BingKen Flor Houdeshell, PT [phone]  (pager)   Eliseo GumKenneth V Arshi Duarte 05/30/2017, 5:06 PM

## 2017-05-30 NOTE — Discharge Instructions (Signed)
Lesin renal aguda en los adultos Acute Kidney Injury, Adult La lesin renal aguda es un empeoramiento repentino de la funcin renal. Los riones son rganos que realizan varias tareas. Filtran la sangre para extraer los desechos y el exceso de lquido. Adems, mantienen un equilibrio saludable de los [address] y las hormonas del [place], lo que ayuda a Chief Operating Officer la presin arterial y [employer] fuertes. Con esta afeccin, los riones no realizan sus tareas tan bien como deberan. La afeccin vara de leve a grave. Con el paso del [place], podra transformarse en una enfermedad renal de larga duracin (crnica). La deteccin y el tratamiento tempranos podran evitar que la lesin renal aguda se transforme en una afeccin crnica. Cules son las causas? Las causas ms frecuentes de esta afeccin [employer] siguientes:  Problemas con el flujo sanguneo a los riones. Las causas de esto pueden ser las siguientes: ? Presin arterial baja (hipotensin) o choque. ? Prdida de sangre. ? Enfermedades cardacas y de los vasos sanguneos (cardiovasculares). ? Quemaduras graves. ? Enfermedad heptica.  Lesin Viacom riones. Las causas de esto pueden ser las siguientes: ? Ciertos medicamentos. ? Infeccin renal. ? Intoxicaciones. ? Cercana a sustancias txicas o contacto con estas. ? Una herida quirrgica. ? Golpe directo e intenso en la zona de los riones.  La obstruccin repentina del flujo de [place]. Las causas de esto pueden ser las siguientes: ? Cncer. ? Clculos en el rin. ? Prstata agrandada en los hombres.  Cules son los signos o los sntomas? Es posible que los sntomas de esta afeccin no sean evidentes hasta que esta se torne grave. Los sntomas de esta afeccin pueden incluir lo siguiente:  Cansancio Retail buyer) o dificultad para Advice worker.  Nuseas o vmitos.  Hinchazn (edema) de la cara, las piernas, los tobillos o los pies.  Problemas  en la miccin, tales como: ? Dolor abdominal o dolor al costado del estmago (fosa lumbar). ? Disminucin de la produccin de [place]. ? Disminucin de la fuerza del flujo de la [place].  Contracciones y calambres musculares, especialmente en las piernas.  Confusin o dificultad para concentrarse.  Prdida del apetito.  Grant Ruts.  Cmo se diagnostica? Esta afeccin se puede diagnosticar mediante diferentes estudios, por ejemplo:  Anlisis de [place].  Anlisis de [place].  Estudios de diagnstico por imgenes.  Un estudio en el que se toma una muestra de tejido de los riones para examinarla con un microscopio (biopsia de rin).  Cmo se trata? El tratamiento de esta afeccin depende de la causa y la gravedad. En los casos leves, puede no ser necesario el tratamiento. Los riones pueden curarse por s solos. En los casos ms graves, el tratamiento podra incluir lo siguiente:  Consulting civil engineer causa de la lesin renal. Podra implicar cambiar algn medicamento que toma o ajustar las dosis de [place].  Lquidos. Podra necesitar lquidos intravenosos (i.v.) especiales para equilibrar las necesidades del organismo.  Tener colocado un catter para drenar la orina y evitar obstrucciones.  Prevencin de problemas. Por ejemplo, evitar ciertos medicamentos o procedimientos que pudieran daar ms los riones.  En algunos casos, el tratamiento tambin podra requerir lo siguiente:  Procedimiento para Special educational needs teacher txicos del organismo (dilisis o terapia de reemplazo renal continuo).  Ciruga. Podra realizarse para reparar un desgarro del rin o para extraer una obstruccin del sistema urinario.  Siga estas indicaciones en su casa: Medicamentos  Baxter International de venta libre y los recetados solamente como se lo haya indicado el  mdico.  No tome ningn medicamento nuevo sin la autorizacin del mdico. Muchos medicamentos pueden empeorar el dao renal.  No tome ningn  suplemento vitamnico ni de minerales sin la autorizacin del mdico. Muchos suplementos nutricionales pueden empeorar el dao renal. Estilo de vida  Si el mdico le indic que hiciera cambios en la dieta, siga las indicaciones. Posiblemente deba disminuir el consumo de protenas en la dieta.  [place] y [place] un peso saludable. Si necesita ayuda para lograrlo, consulte a su mdico.  Comience o contine un plan de ejercicios. Intente hacer ejercicios al menos al da, 5das a la semana.  No consuma ningn producto que contenga tabaco, lo que incluye cigarrillos, tabaco de Theatre manager y Administrator, Civil Service. Si necesita ayuda para dejar de fumar, consulte al mdico. Instrucciones generales  Lleve un control de la presin arterial. Informe los cambios en la presin arterial como se lo haya indicado el mdico.  Mantngase al da con las vacunas. Pregntele al mdico qu vacunas debe colocarse.  Concurra a todas las visitas de control como se lo haya indicado el mdico. Esto es importante. Dnde encontrar ms informacin:  Asociacin Americana de Pacientes Renales (American Association of Kidney Patients, AAKP): ResidentialShow.is  [employer] Corporation del Rin (National Kidney Foundation): www.kidney.org  Fondo Americano para Problemas Renales (American Kidney Fund): FightingMatch.com.ee  Programa de rehabilitacin de Life Options: ? www.lifeoptions.org ? www.kidneyschool.org Comunquese con un mdico si:  Los sntomas empeoran.  Presenta nuevos sntomas. Solicite ayuda de inmediato si:  Le aparecen sntomas de empeoramiento de la enfermedad renal, por ejemplo: ? Dolores de [place]. ? La piel se oscurece o se aclara de manera anormal. ? Aparecen hematomas con facilidad. ? Hipo frecuente. ? Dolor en el pecho. ? Falta de aire. ? Falta de perodo menstrual en las mujeres. ? Convulsiones. ? Confusin o estado mental alterado. ? Dolor en el abdomen o en la  espalda. ? Picazn.  Tiene fiebre.  El organismo produce [place] menor cantidad [place].  Siente dolor o tiene sangre al ConocoPhillips. Resumen  La lesin renal aguda es un empeoramiento repentino de la funcin renal.  Puede ser consecuencia de problemas en el flujo sanguneo que va hacia los riones, dao directo en los riones y obstruccin repentina del flujo de la [place].  Es posible que los sntomas de esta afeccin no sean evidentes hasta que se torne grave. Algunos de los sntomas podran ser edema, letargo, confusin, nuseas o vmitos, y problemas para Geographical information systems officer.  Generalmente, esta afeccin puede diagnosticarse con anlisis de [place], anlisis de [place] y estudios de diagnstico por imgenes. A veces, se realiza una biopsia de rin para diagnosticarla.  El tratamiento de la afeccin suele incluir el tratamiento de la afeccin preexistente. Se trata con lquidos, medicamentos, dilisis, cambios en la dieta o [place]. Esta informacin no tiene Theme park manager el consejo del mdico. Asegrese de hacerle al mdico cualquier pregunta que tenga. Document Released: 04/15/2012 Document Revised: 09/04/2016 Document Reviewed: 12/27/2011 Elsevier Interactive Patient Education  2018 ArvinMeritor.   Colocacin de fstula AV, cuidados posteriores (AV Fistula Placement, Care After) Siga estas instrucciones durante las prximas semanas. Estas indicaciones le proporcionan informacin acerca de cmo deber cuidarse despus del procedimiento. El mdico tambin podr darle instrucciones ms especficas. El tratamiento ha sido planificado segn las prcticas mdicas actuales, pero en algunos casos pueden ocurrir problemas. Comunquese con el mdico si tiene algn problema o dudas despus del procedimiento. QU ESPERAR DESPUS DEL PROCEDIMIENTO Despus del procedimiento, es comn DIRECTV siguientes sntomas:  Sentir molestias.  Tener entumecimiento.  Sentir fro.  Sentir una vibracin (temblor)  sobre la fstula. INSTRUCCIONES PARA EL CUIDADO EN EL HOGAR Cuidado de la incisin  No tome baos de inmersin, no se duche, no nade ni use el jacuzzi hasta que el mdico lo autorice.  Mantenga la zona que rodea el corte de la ciruga (incisin) limpia y Magazine features editor.  Siga las indicaciones del mdico acerca del cuidado de la incisin. Haga lo siguiente: ? Lvese las manos con agua y jabn antes de Multimedia programmer las vendas (vendaje). Use desinfectante para manos si no dispone de [place] y [place]. ? Cambie el vendaje como se lo haya indicado el mdico. ? No retire los puntos (suturas) ni las grapas. Tal vez deban dejarse puestos en la piel durante 2semanas o ms tiempo. Cuidado de Stage manager de la fstula todos los das para asegurarse de que el temblor se sienta igual.  Haematologist de la fstula todos los [address] para descartar signos de infeccin. Est atento a lo siguiente: ? Enrojecimiento. ? Hinchazn. ? Secrecin. ? Dolor a Insurance claims handler. ? Agrandamiento.  Mantenga el brazo levantado (elevado) mientras descansa.  No levante nada que pese ms de un galn de leche (4,5kg) con el brazo que tiene la fstula.  No se recueste sobre el brazo de la fstula.  No permita que le saquen sangre o le tomen la presin arterial en el brazo de la fstula.  No use joyas ni ropa apretadas en el brazo que tiene la fstula. Instrucciones generales  Descanse por [employer].  Retome sus actividades habituales de forma gradual. Pregntele al Nicholes Mango cundo podr volver a la escuela o al Aleen Campi.  Tome los medicamentos de venta libre y los recetados solamente como se lo haya indicado el mdico.  Oceanographer a todas las visitas de control como se lo haya indicado el mdico. Esto es importante. SOLICITE ATENCIN MDICA SI:  Tiene escalofros o fiebre.  Siente dolor en el lugar de la fstula, que no desaparece.  Tiene entumecimiento o sensacin de fro en el lugar de la  fstula, que no desaparece.  Siente una disminucin o un cambio en el temblor.  Tiene hinchazn en la mano o el brazo.  Tiene enrojecimiento, hinchazn, secrecin, dolor a la palpacin o agrandamiento del lugar de la fstula. SOLICITE ATENCIN MDICA DE INMEDIATO SI:  Tiene una hemorragia que emana de la fstula.  Siente dolor en el pecho.  Tiene dificultad para respirar. Esta informacin no tiene Theme park manager el consejo del mdico. Asegrese de hacerle al mdico cualquier pregunta que tenga. Document Released: 04/29/2005 Document Revised: 08/21/2015 Document Reviewed: 07/20/2014 Elsevier Interactive Patient Education  2018 ArvinMeritor.   Hemodilisis (Hemodialysis) La hemodilisis es una forma de eliminar los desechos, y la sal y el agua en exceso de la [place]. Se realiza cuando los riones no pueden mantener la sangre limpia. Durante la hemodilisis, la Pensions consultant del cuerpo ConocoPhillips. Un filtro en la mquina limpia la [place]. La hemodilisis se realiza 3 veces por semana. Cada sesin dura entre 3 y 5 horas. ANTES DEL PROCEDIMIENTO Se realizar una incisin para permitir la extraccin y luego reinfusin de la sangre de su cuerpo (acceso). Se realiza semanas o meses antes de iniciar la hemodilisis. En general se realiza en el brazo. Si usted necesita la hemodilisis de inmediato, Museum/gallery exhibitions officer un tubo delgado y flexible (catter) en el cuello, el trax o la ingle. Mirant  tipo de acceso es generalmente transitorio. PROCEDIMIENTO La hemodilisis se realiza mientras se est sentado o recostado. Durante la hemodilisis puede Heritage manager [place], siempre y cuando permanezca sentado o recostado. Muchas personas duermen, ven la televisin o leen. Si usted tiene Lexmark International o se siente incmodo, comntelo al mdico. El mdico puede hacer cambios para ayudarlo a sentirse ms cmodo. Sus sesiones de hemodilisis pueden tener estas caractersticas: 1. Se lo pesar  y se Conservation officer, nature. Controlarn su presin arterial y el pulso. 2. Se esterilizar la piel de alrededor del acceso vascular. 3. Inyectarn dos agujas en el Tesoro Corporation. Las agujas estarn conectadas a un tubo de plstico. Se pegarn con cinta en la piel para que no se muevan. Si usted tiene Social worker transitorio, Associate Professor se conecta a un tubo de plstico. 4. La sangre pasar por el tubo a la mquina. La mquina Progress Energy. Luego la sangre volver a su cuerpo. Controlarn su presin arterial y el pulso algunas veces. 5. Una vez completado el procedimiento, se retiran las agujas. Se colocar un vendaje (apsito) sobre el Tesoro Corporation. Si el acceso es un catter, Chief Technology Officer. DESPUS DEL PROCEDIMIENTO  Se lo pesar.  Se analizar la sangre. Esto se hace generalmente una vez al mes.  Podr experimentar efectos secundarios. Estos incluyen: ? Mareos. ? Calambres musculares. ? Ganas de vomitar (nuseas). ? Dolores de [place]. ? Sensacin de cansancio. Por lo general volver a sentirse normal al da siguiente. ? Picazn. Su mdico puede darle medicamentos para calmarla. ? Dolor o temblores en las piernas. Puede sentir como que sus piernas patean. Esto puede causar problemas para dormir. ? Reacciones alrgicas.  ASEGRESE DE QUE:  Comprende estas instrucciones.  Controlar su enfermedad.  Solicitar ayuda de inmediato si no mejora o si empeora.  Esta informacin no tiene Theme park manager el consejo del mdico. Asegrese de hacerle al mdico cualquier pregunta que tenga. Document Released: 08/14/2010 Document Revised: 08/24/2012 Document Reviewed: 06/15/2012 Elsevier Interactive Patient Education  2017 Elsevier Inc.   Dieta para dilisis (Dialysis Diet) La dilisis es un tratamiento que limpia la sangre y se realiza cuando hay dao renal. Las personas que deben someterse a dilisis tienen que cuidar su dieta. Esto se debe a que algunos nutrientes pueden acumularse en la  sangre entre un tratamiento y [place], y causar enfermedades. Estos nutrientes son los siguientes:  Potasio.  Fsforo.  Sodio. El mdico o el nutricionista harn lo siguiente:  Le dirn la cantidad de estos nutrientes que puede consumir.  Le dirn si, adems, tiene que tener cuidado con otros nutrientes.  Lo ayudarn a planificar las comidas.  Le dirn la cantidad de lquido que tiene que beber a diario. QU DEBO SABER ACERCA DE ESTA DIETA?  Limite el consumo de potasio. La [place], las frutas y las verduras contienen potasio.  Limite el consumo de fsforo. La [place], el queso, los frijoles, los frutos secos y las gaseosas contienen fsforo.  Limite el consumo de sal (sodio). Entre los alimentos que contienen una gran cantidad de sodio se incluyen las carnes procesadas y los embutidos, las comidas congeladas preelaboradas, las verduras enlatadas y las Paediatric nurse.  No use sustitutos de la sal.  Intente no comer alimentos integrales y alimentos que Sprint Nextel Corporation.  Siga las indicaciones del mdico respecto de la cantidad de lquido que tiene que beber. Es posible que le indiquen que: ? Anote lo que bebe. ? Anote los alimentos que consume que estn elaborados principalmente a base  de agua, como gelatinas y sopas. ? Beba los lquidos en tazas pequeas.  Pregntele al mdico si debe tomar un medicamento fijador de fsforo.  Tome vitaminas y suplementos minerales solamente como se lo haya indicado el mdico.  Coma carne, carne de ave, pescado y huevos. Limite el consumo de frutos secos y frijoles.  Antes de cocinar papas, crtelas en trozos pequeos y luego hirvalas en agua sin sal.  Elimine todo el lquido de las verduras cocidas y las frutas enlatadas antes de consumirlas. QU ALIMENTOS PUEDO COMER? Cereales Pan blanco. Arroz blanco. Cereales cocidos. Palomitas de maz sin sal. Tortillas. Pastas. Verduras Brcoli, zanahorias y judas verdes frescas o congeladas.  Repollo. Coliflor. Apio. Pepinos. Christella NoaBerenjena. Rbanos. Calabacn. Frutas Manzanas. Frutos rojos frescos o congelados. Peras, duraznos y anan frescos o enlatados. Uvas. Ciruelas. Carnes y [address]otras fuentes de protenas Carne, cerdo, pollo y pescado frescos o congelados. Huevos. Atn o salmn enlatado con bajo contenido de sodio. Lcteos Queso crema. Crema espesa. Queso ricota. Bebidas Sidra de [place]manzana. Jugo de arndanos. Mosto. Limonada. Caf negro. Condimentos Hierbas. Especias. Mermelada y Kazakhstanjalea. Miel. Dulces y VF Corporationpostres Sorbete. Bizcochuelos. Galletas. Grasas y [address] Aceite de [place] Havenoliva, canola y Suffieldgirasol. Otros Crema no lctea. Cobertura batida no lctea. Caldos caseros sin sal. Los artculos mencionados arriba pueden no ser Raytheonuna lista completa de las bebidas o los alimentos recomendados. Comunquese con el nutricionista para conocer ms opciones. QU ALIMENTOS NO SE RECOMIENDAN? Cereales Panes integrales. Pastas integrales. Cereales con alto contenido de [place] Libertyfibra. Verduras Papas. Remolachas. Tomates. Zapallo y calabaza. Esprragos. Espinaca. Chiriva. Nils PyleFrutas Carambola. Bananas. Naranjas. Kiwi. Pelones. Ciruelas pasas. Meln. Frutas desecadas. [place]. Carnes y otras fuentes de protenas Carnes enlatadas y Dewartahumadas, y embutidos. Fiambres envasados. Sardinas. Frutos secos y semillas. Mantequilla de man. Frijoles y legumbres. Lcteos Leche. Suero de Gibraltarleche. Yogur. Queso y Leggett & Plattqueso cottage. Quesos untables procesados. Bebidas Jugo de naranjas. Jugo de ciruelas. Bebidas gaseosas. Condimentos Sal. Sustitutos de la sal. Salsa de soja. Dulces y TXU Corppostres Helados. Chocolate. Pralin de frutos secos. Grasas y Barnes & Nobleaceites Mantequilla. Margarina. Otros Comidas congeladas preelaboradas. Sopas enlatadas. Los artculos mencionados arriba pueden no ser Raytheonuna lista completa de las bebidas y los alimentos que se Theatre stage managerdeben evitar. Comunquese con el nutricionista para obtener ms informacin. Esta informacin no  tiene Theme park managercomo fin reemplazar el consejo del mdico. Asegrese de hacerle al mdico cualquier pregunta que tenga. Document Released: 10/29/2011 Document Revised: 05/20/2014 Document Reviewed: 11/30/2013 Elsevier Interactive Patient Education  2018 ArvinMeritorElsevier Inc.   [facility]  Discharge Instructions  AV Fistula or Graft Surgery for Dialysis Access  Please refer to the following instructions for your post-procedure care. Your surgeon or physician assistant will discuss any changes with you.  Activity  You may drive the day following your surgery, if you are comfortable and no longer taking prescription pain medication. Resume full activity as the soreness in your incision resolves.  Bathing/Showering  You may shower after you go home. Keep your incision dry for 48 hours. Do not soak in a bathtub, hot tub, or swim until the incision heals completely. You may not shower if you have a hemodialysis catheter.  Incision Care  Clean your incision with mild soap and water after 48 hours. Pat the area dry with a clean towel. You do not need a bandage unless otherwise instructed. Do not apply any ointments or creams to your incision. You may have skin glue on your incision. Do not peel it off. It will come off on its own in about  one week. Your arm may swell a bit after surgery. To reduce swelling use pillows to elevate your arm so it is above your heart. Your doctor will tell you if you need to lightly wrap your arm with an ACE bandage.  Diet  Resume your normal diet. There are not special food restrictions following this procedure. In order to heal from your surgery, it is CRITICAL to get adequate nutrition. Your body requires vitamins, minerals, and protein. Vegetables are the best source of vitamins and minerals. Vegetables also provide the perfect balance of protein. Processed food has little nutritional value, so try to avoid this.  Medications  Resume taking all  of your medications. If your incision is causing pain, you may take over-the counter pain relievers such as acetaminophen (Tylenol). If you were prescribed a stronger pain medication, please be aware these medications can cause nausea and constipation. Prevent nausea by taking the medication with a snack or meal. Avoid constipation by drinking plenty of fluids and eating foods with high amount of fiber, such as fruits, vegetables, and grains. Do not take Tylenol if you are taking prescription pain medications.  Follow up Your surgeon may want to see you in the office following your access surgery. If so, this will be arranged at the time of your surgery.  Please call us immediately for any of the following conditions:  Increased pain, redness, drainage (pus) from your incision site Fever of 101 degrees or higher Severe or worsening pain at your incision site Hand pain or numbness.  Reduce your risk of vascular disease:  Stop smoking. If you would like help, call [facility] at 1-800-QUIT-NOW ((805) 656-7390) or [place] at 7183946278  Manage your cholesterol Maintain a desired weight Control your diabetes Keep your blood pressure down  Dialysis  It will take several weeks to several months for your new dialysis access to be ready for use. Your surgeon will determine when it is OK to use it. Your nephrologist will continue to direct your dialysis. You can continue to use your Permcath until your new access is ready for use.   05/27/2017 Davonne Jarnigan [id number] December 07, 1950  Surgeon(s): Sherren Kerns, MD  Procedure(s): INSERTION OF Right Internal Jugular TUNNELED DIALYSIS CATHETER Creation of Left Brachiocephalic arteriovenous Fistula  x Do not stick fistula for 12 weeks    If you have any questions, please call the office at [phone].

## 2017-05-30 NOTE — Progress Notes (Signed)
  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  s/p TDC and LUE AVF.  HD started 05/28/17. HD #3 today.  [facility] 2nd [facility].  To begin Monday.  OK to go today from renal perspective. 2. DM2 - on insulin 3. HTN - worse due to vol overload, cont coreg/ hydral and imdur 4. New Afib: on PO dilt and warfarin started 5. Anemia: Hgb 11.5, no ESA yet,  Iron sat 14%, IV iron course started 05/28/17. 6. BMD: PTH 118 12/31, Phos 5.7.  Ca acetate with meals.  Doesn't need VDRA just yet 7. Dispo: OK to go from renal perspective today.   Subjective:    HD #3 today.  CLIP'd.  Nausea and breathing better.   Objective:   BP (!) 165/62   Pulse 66   Temp 98 F (36.7 C) (Oral)   Resp 16   Ht 5\' 2"  (1.575 m)   Wt 96 kg (211 lb 10.3 oz)   SpO2 95%   BMI 38.71 kg/m   Physical Exam: Gen: older gentleman, NAD CVS:RRR, no S3 today Resp: wearing O2, bilateral crackles improved Abd: distention improved Ext: edema improving ACCESS: TDC in place and LUE AVF + T/B  Labs: BMET Recent Labs  Lab 05/25/17 0839 05/26/17 0658 05/26/17 1427 05/27/17 0409 05/27/17 1345 05/29/17 0801 05/30/17 0920  NA 138 139 137 137 138 136 137  K 5.3* 4.7 4.6 4.1 4.2 3.8 3.8  CL 108 107 105 104 103 98* 100*  CO2 19* 21* 21* 22 23 26 27   GLUCOSE 122* 107* 167* 126* 121* 124* 134*  BUN 86* 87* 87* 90* 87* 61* 32*  CREATININE 5.35* 5.54* 5.68* 5.80* 5.71* 5.12* 4.04*  CALCIUM 8.4* 8.3* 8.0* 8.1* 8.2* 8.3* 8.5*  PHOS  --   --   --   --   --  6.1* 4.3   CBC Recent Labs  Lab 05/25/17 0839 05/26/17 0658 05/29/17 0801 05/30/17 0920  WBC 6.0 6.4 5.5 6.2  HGB 12.3* 11.5* 11.5* 11.7*  HCT 39.3 36.3* 34.9* 37.0*  MCV 98.0 97.3 96.1 97.1  PLT 240 216 188 187    @IMGRELPRIORS @ Medications:    . calcium acetate  667 mg Oral TID  WC  . carvedilol  12.5 mg Oral BID WC  . diltiazem  60 mg Oral Q8H  . heparin  5,000 Units Subcutaneous Q8H  . hydrALAZINE  100 mg Oral Q8H  . Influenza vac split quadrivalent PF  0.5 mL Intramuscular Tomorrow-1000  . insulin aspart  0-9 Units Subcutaneous Q4H  . isosorbide mononitrate  90 mg Oral Daily  . mouth rinse  15 mL Mouth Rinse BID  . pneumococcal 23 valent vaccine  0.5 mL Intramuscular Tomorrow-1000  . Warfarin - Pharmacist Dosing Inpatient   Does not apply q1800     Bufford ButtnerElizabeth Jenevieve Kirschbaum, MD [facility] pgr [phone] 05/30/2017, 12:57 PM

## 2017-05-30 NOTE — Progress Notes (Signed)
OT Cancellation Note  Patient Details Name: Nicholas Gutierrez MRN: [id number] DOB: [birth year]   Cancelled Treatment:    Reason Eval/Treat Not Completed: Patient at procedure or test/ unavailable(HD), will follow up as schedule permits and as Pt is available.   Marcy SirenBreanna Kapena Hamme, OT Pager [phone] 05/30/2017  Nicholas Gutierrez 05/30/2017, 12:06 PM

## 2017-05-30 NOTE — Discharge Summary (Addendum)
Discharge Summary  Nicholas Gutierrez WGN:[id number] DOB: [birth year]  PCP: Patient, No Pcp Per  Admit date: 05/24/2017 Discharge date: 05/30/2017  Time spent: 25 minutes   Recommendations for Outpatient Follow-up:  1. Continue dialysis appointments/sessions 2. Follow up with nephrology 3. Follow up with your PCP within a week to check your INR while on coumadin 4. Renal dialysis diet 5. Take your medications as prescribed  Discharge Diagnoses:  Active Hospital Problems   Diagnosis Date Noted  . Chronic kidney disease (CKD), stage V (HCC) 05/24/2017  . Healthcare-associated pneumonia 05/24/2017  . Acute respiratory failure (HCC) 05/24/2017  . Acute exacerbation of CHF (congestive heart failure) (HCC) 05/24/2017  . Type 2 diabetes with nephropathy (HCC) 05/11/2017  . Acute diastolic CHF (congestive heart failure) (HCC) 05/11/2017  . BPH with urinary obstruction 05/11/2017  . Adrenal mass [facility](HCC) 05/11/2017    Resolved Hospital Problems  No resolved problems to display.    Discharge Condition: stable   Diet recommendation: Renal dialysis diet  Vitals:   05/30/17 1230 05/30/17 1305  BP: (!) 165/62 (!) 154/84  Pulse: 66 70  Resp:  18  Temp:  98.1 F (36.7 C)  SpO2:  99%    History of present illness:  Nicholas BushyLuis Kintzel is a 67 y.o. Spanish speaking malewithmedical history significant for DM, CKD stage 5, glomerulosclerosis,[facility], then [facility]till 1/5/2018with acute renal failure. At the time, he had been diagnosed with hypertensive urgency, type 2 diabetes, and nephropathy, he also was noted to have an adrenal mass, acute renal arterial thrombosis, nephrotic syndrome with hypoalbuminemia, chronic glomerulonephritis, and as mentioned CKD. He was supposed to have a nephrologist evaluation on 06/03/2017 to discuss HD cath insertion.He now presents to the ED with progressive shortness of breathsince his  discharge,stating that he continues to have low amount of urine despite lasix. Worsening lower extremity edema. Patient admitted due to fluid overload in the setting of new ESRD on dialysis. Nephrology consulted and followed the patient. Hemodialysis sessions with no adverse events.  On the day of discharge the patient was hemodynamically stable. He denies chest pain, dyspnea or palpitations. Patient will need to keep appointments for his dialysis sessions, will also need to follow up with his PCP and nephrologist.   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 Now started on HD, day #3 -Vascular surgery -status post TDC and left upper extremity AV fistula   -clip process -continue dialysis sessions  Hypertension -BP is stable -Continue coreg, imdur, hydralazine    Type II Diabetes  -HA1C 6.2 -SSI Patient also on NPH 70/30 at home which can be resumed at discharge  Incidental adrenal mass -Follow up outpatient   new onset paroxysmal atrial flutter/atrial fibrillation 1/14-1/15 with a heart rate of 110 to 130 Initiated on Cardizem drip briefly on 1/15 converted back to normal sinus rhythm 05/29/17 Switched to PO cardizem Suspect this is partly due to  volume overload, and will improve with dialysis  2-D echo shows EF of  55-60% with grade 2 diastolic dysfunction. Pressure of 62 mm TSH 3.87 Now on Coumadin  for anticoagulation  Hypertension Continue Coreg, Cardizem, hydralazine, Imdur,  Consultants:   Nephrology  Vascular surgery   Procedures:   None   Discharge Exam: BP (!) 154/84 (BP Location: Right Arm)   Pulse 70   Temp 98.1 F (36.7 C) (Oral)   Resp 18   Ht 5\' 2"  (1.575 m)   Wt 94.3 kg (207 lb 14.3 oz)   SpO2 99%   BMI 38.02 kg/m   General: 67 yo M WD WN NAD A&O x 3 Cardiovascular: RRR no rubs or gallops Respiratory: CTA with no wheezes or rales  Discharge Instructions You were cared for by a hospitalist during your hospital stay. If you have any questions about your discharge medications or the care you received while you were in the hospital after you are discharged, you can call the unit and asked to speak with the hospitalist on call if the hospitalist that took care of you is not available. Once you are discharged, your primary care physician will handle any further medical issues. Please note that NO REFILLS for any discharge medications will be authorized once you are discharged, as it is imperative that you return to your primary care physician (or establish a relationship with a primary care physician if you do not have one) for your aftercare needs so that they can reassess your need for medications and monitor your lab values.   Allergies as of 05/30/2017   No Known Allergies     Medication List    TAKE these medications   carvedilol 12.5 MG tablet Commonly known as:  COREG Take 1 tablet (12.5 mg total) by mouth 2 (two) times daily with a meal.   diltiazem 60 MG tablet Commonly known as:  CARDIZEM Take 1 tablet (60 mg total) by mouth 3 (three) times daily.   furosemide 40 MG tablet Commonly known as:  LASIX Take 1 tablet (40 mg total) by mouth daily.   hydrALAZINE 100 MG tablet Commonly known as:  APRESOLINE Take 1 tablet (100 mg total) by mouth every 8 (eight) hours.   insulin aspart protamine- aspart (70-30) 100 UNIT/ML injection Commonly  known as:  NOVOLOG MIX 70/30 Inject 0.12 mLs (12 Units total) into the skin 2 (two) times daily with a meal.   isosorbide mononitrate 30 MG 24 hr tablet Commonly known as:  IMDUR Take 3 tablets (90 mg total) by mouth daily.   polyethylene glycol packet Commonly known as:  MIRALAX / GLYCOLAX Take 17 g by mouth daily.   Vitamin D (Ergocalciferol) 50000 units Caps capsule Commonly known as:  DRISDOL Take 1 capsule (50,000 Units total) by mouth every 7 (seven) days.   warfarin 2.5 MG tablet Commonly known as:  COUMADIN Take 1 tablet (2.5 mg total) by mouth one time only at 6 PM.      No Known Allergies Follow-up Information    Sherren Kerns, MD Follow up in 6 week(s).   Specialties:  Vascular Surgery, Cardiology Why:  Office will call you to arrange your appt (sent) Contact information: [address] [place] [address] [phone]        [facility] Follow up.   Specialty:  Home Health Services Why:  Physical Therapy Contact information: [address] [place] [address] [phone]  The results of significant diagnostics from this hospitalization (including imaging, microbiology, ancillary and laboratory) are listed below for reference.    Significant Diagnostic Studies: Ct Abdomen Pelvis Wo Contrast  Result Date: 05/24/2017 CLINICAL DATA:  Patient has a history of congestive heart failure. Increasing abdominal distension in the last few days. EXAM: CT CHEST AND ABDOMEN WITHOUT CONTRAST TECHNIQUE: Multidetector CT imaging of the chest and abdomen was performed following the standard protocol without intravenous contrast. COMPARISON:  None. FINDINGS: CT CHEST FINDINGS WITHOUT CONTRAST Cardiovascular: Atherosclerosis of the aorta is identified. The heart size is enlarged. There is no pericardial effusion. Mediastinum/Nodes: There are mild prominent mediastinal and hilar lymph nodes largest measuring 1 cm. These may be  reactive lymph nodes. The trachea is normal. The esophagus is unremarkable. The thyroid glands are normal. Lungs/Pleura: There are large bilateral pleural effusions. There are patchy consolidations most prominently involving the bilateral lower lobes but also to a lesser degree involves the right upper lobe and right middle lobe. Musculoskeletal: No acute abnormality. CT ABDOMEN FINDINGS WITHOUT CONTRAST Hepatobiliary: Left lobe liver is enlarged with mild nodular contour of liver suggesting cirrhosis of liver. No focal liver lesion is identified. The gallbladder is normal. Minimal ascites is noted surrounding gallbladder. The biliary tree is normal. Pancreas: Unremarkable. No pancreatic ductal dilatation or surrounding inflammatory changes. Spleen: Normal in size without focal abnormality. Adrenals/Urinary Tract: Adrenal glands are unremarkable. Kidneys are normal, without renal calculi, focal lesion, or hydronephrosis. Minimal bilateral perinephric fluid is identified. Bladder is unremarkable. Stomach/Bowel: Stomach is within normal limits. Appendix appears normal. No evidence of bowel wall thickening, distention, or inflammatory changes. Vascular/Lymphatic: Aortic atherosclerosis. No enlarged abdominal or pelvic lymph nodes. Other: Minimal ascites is identified in the abdomen. Musculoskeletal: No acute abnormality is noted. IMPRESSION: Large bilateral pleural effusions. Patchy consolidations throughout both lungs suspicious for pneumonia. Minimal ascites in the abdomen. No acute abnormality identified in the abdomen and pelvis. Electronically Signed   By: Sherian Rein M.D.   On: 05/24/2017 15:04   Dg Chest 2 View  Result Date: 05/25/2017 CLINICAL DATA:  CHF, fluid overload, pneumonia EXAM: CHEST  2 VIEW COMPARISON:  05/24/2017 FINDINGS: Cardiomegaly with vascular congestion. Interstitial prominence in the lungs may reflect interstitial edema. Small bilateral effusions and bibasilar atelectasis. IMPRESSION:  Mild interstitial edema. Small effusions with bibasilar atelectasis. Electronically Signed   By: Charlett Nose M.D.   On: 05/25/2017 08:13   Dg Chest 2 View  Result Date: 05/24/2017 CLINICAL DATA:  Shortness of breath, chest pain and history of CHF. EXAM: CHEST  2 VIEW COMPARISON:  12/01/2006 FINDINGS: The heart size and mediastinal contours are within normal limits. There is evidence mild to moderate pulmonary edema and probable small bilateral pleural effusions. Atelectasis present at both lung bases. The visualized skeletal structures are unremarkable. IMPRESSION: Mild to moderate pulmonary edema. Electronically Signed   By: Irish Lack M.D.   On: 05/24/2017 12:41   Ct Chest Wo Contrast  Result Date: 05/24/2017 CLINICAL DATA:  Patient has a history of congestive heart failure. Increasing abdominal distension in the last few days. EXAM: CT CHEST AND ABDOMEN WITHOUT CONTRAST TECHNIQUE: Multidetector CT imaging of the chest and abdomen was performed following the standard protocol without intravenous contrast. COMPARISON:  None. FINDINGS: CT CHEST FINDINGS WITHOUT CONTRAST Cardiovascular: Atherosclerosis of the aorta is identified. The heart size is enlarged. There is no pericardial effusion. Mediastinum/Nodes: There are mild prominent mediastinal and hilar lymph nodes largest measuring 1 cm. These may be reactive lymph nodes. The  trachea is normal. The esophagus is unremarkable. The thyroid glands are normal. Lungs/Pleura: There are large bilateral pleural effusions. There are patchy consolidations most prominently involving the bilateral lower lobes but also to a lesser degree involves the right upper lobe and right middle lobe. Musculoskeletal: No acute abnormality. CT ABDOMEN FINDINGS WITHOUT CONTRAST Hepatobiliary: Left lobe liver is enlarged with mild nodular contour of liver suggesting cirrhosis of liver. No focal liver lesion is identified. The gallbladder is normal. Minimal ascites is noted  surrounding gallbladder. The biliary tree is normal. Pancreas: Unremarkable. No pancreatic ductal dilatation or surrounding inflammatory changes. Spleen: Normal in size without focal abnormality. Adrenals/Urinary Tract: Adrenal glands are unremarkable. Kidneys are normal, without renal calculi, focal lesion, or hydronephrosis. Minimal bilateral perinephric fluid is identified. Bladder is unremarkable. Stomach/Bowel: Stomach is within normal limits. Appendix appears normal. No evidence of bowel wall thickening, distention, or inflammatory changes. Vascular/Lymphatic: Aortic atherosclerosis. No enlarged abdominal or pelvic lymph nodes. Other: Minimal ascites is identified in the abdomen. Musculoskeletal: No acute abnormality is noted. IMPRESSION: Large bilateral pleural effusions. Patchy consolidations throughout both lungs suspicious for pneumonia. Minimal ascites in the abdomen. No acute abnormality identified in the abdomen and pelvis. Electronically Signed   By: Sherian Rein M.D.   On: 05/24/2017 15:04   US Renal  Result Date: 05/15/2017 CLINICAL DATA:  Renal biopsy May 14, 2017. Chronic renal disease. EXAM: RENAL / URINARY TRACT ULTRASOUND COMPLETE COMPARISON:  None. FINDINGS: Right Kidney: Length: 11.4 cm. Echogenicity within normal limits. No mass or hydronephrosis visualized. Left Kidney: Length: 11.9 cm. Echogenicity within normal limits. No mass or hydronephrosis visualized. Bladder: Bladder wall thickening measuring 6 mm.  Poor distention. IMPRESSION: 1. The kidneys are normal in appearance. 2. The bladder wall appears somewhat thickened measuring 6 mm. Recommend clinical correlation. Electronically Signed   By: Gerome Sam III M.D   On: 05/15/2017 14:36   Dg Chest Port 1 View  Result Date: 05/27/2017 CLINICAL DATA:  Status post dialysis catheter placement. EXAM: PORTABLE CHEST 1 VIEW COMPARISON:  Radiograph of May 25, 2017. FINDINGS: Stable cardiomediastinal silhouette. Stable mild  central pulmonary vascular congestion is noted with possible bilateral pulmonary edema. Mild left pleural effusion is again noted and stable. Interval placement of right internal jugular catheter with distal tip in expected position of cavoatrial junction. No pneumothorax is noted. Bony thorax is unremarkable. IMPRESSION: Stable central pulmonary vascular congestion is noted with possible bilateral pulmonary edema. Stable mild left pleural effusion. Interval placement of right internal jugular dialysis catheter with distal tip in expected position of cavoatrial junction. Electronically Signed   By: Lupita Raider, M.D.   On: 05/27/2017 13:08   Dg Fluoro Guide Cv Line-no Report  Result Date: 05/27/2017 Fluoroscopy was utilized by the requesting physician.  No radiographic interpretation.   US Biopsy (kidney)  Result Date: 05/14/2017 INDICATION: Acute renal failure, diabetes, hypertension EXAM: ULTRASOUND RIGHT RENAL BIOPSY MEDICATIONS: 1% LIDOCAINE LOCAL ANESTHESIA/SEDATION: Moderate (conscious) sedation was employed during this procedure. A total of Versed 2.0 mg and Fentanyl 50 mcg was administered intravenously. Moderate Sedation Time: 10 minutes. The patient's level of consciousness and vital signs were monitored continuously by radiology nursing throughout the procedure under my direct supervision. FLUOROSCOPY TIME:  Fluoroscopy Time: NONE. COMPLICATIONS: None immediate. PROCEDURE: Informed written consent was obtained from the patient after a thorough discussion of the procedural risks, benefits and alternatives. All questions were addressed. Maximal Sterile Barrier Technique was utilized including caps, mask, sterile gowns, sterile gloves, sterile drape, hand hygiene and  skin antiseptic. A timeout was performed prior to the initiation of the procedure. Patient positioned prone. Preliminary ultrasound performed. Right kidney is slightly more visible for ultrasound percutaneous access. Overlying skin  marked. Under sterile conditions and local anesthesia, a 15 gauge coaxial guide needle was advanced to the right kidney lower pole. Needle position confirmed with ultrasound. 2 16 gauge core biopsies obtained through the access. Samples were intact and non fragmented. These were placed in saline. Needle removed. Postprocedure imaging demonstrates no hemorrhage or hematoma. Patient on the biopsy well. IMPRESSION: Successful ultrasound right kidney core biopsy. Electronically Signed   By: Judie Petit.  Shick M.D.   On: 05/14/2017 13:38    Microbiology: Recent Results (from the past 240 hour(s))  Urine culture     Status: None   Collection Time: 05/24/17  3:33 PM  Result Value Ref Range Status   Specimen Description URINE, CLEAN CATCH  Final   Special Requests NONE  Final   Culture NO GROWTH  Final   Report Status 05/25/2017 FINAL  Final  Blood culture (routine x 2)     Status: None   Collection Time: 05/24/17  4:15 PM  Result Value Ref Range Status   Specimen Description BLOOD RIGHT ANTECUBITAL  Final   Special Requests   Final    BOTTLES DRAWN AEROBIC AND ANAEROBIC Blood Culture results may not be optimal due to an excessive volume of blood received in culture bottles   Culture NO GROWTH 5 DAYS  Final   Report Status 05/29/2017 FINAL  Final  Blood culture (routine x 2)     Status: None   Collection Time: 05/24/17  4:20 PM  Result Value Ref Range Status   Specimen Description BLOOD LEFT ANTECUBITAL  Final   Special Requests   Final    BOTTLES DRAWN AEROBIC AND ANAEROBIC Blood Culture results may not be optimal due to an excessive volume of blood received in culture bottles   Culture NO GROWTH 5 DAYS  Final   Report Status 05/29/2017 FINAL  Final  MRSA PCR Screening     Status: None   Collection Time: 05/24/17  9:56 PM  Result Value Ref Range Status   MRSA by PCR NEGATIVE NEGATIVE Final    Comment:        The GeneXpert MRSA Assay (FDA approved for NASAL specimens only), is one component of  a comprehensive MRSA colonization surveillance program. It is not intended to diagnose MRSA infection nor to guide or monitor treatment for MRSA infections.   Surgical PCR screen     Status: None   Collection Time: 05/27/17  1:01 AM  Result Value Ref Range Status   MRSA, PCR NEGATIVE NEGATIVE Final   Staphylococcus aureus NEGATIVE NEGATIVE Final    Comment: (NOTE) The Xpert SA Assay (FDA approved for NASAL specimens in patients 23 years of age and older), is one component of a comprehensive surveillance program. It is not intended to diagnose infection nor to guide or monitor treatment.      Labs: Basic Metabolic Panel: Recent Labs  Lab 05/26/17 1427 05/27/17 0409 05/27/17 1345 05/29/17 0801 05/30/17 0920  NA 137 137 138 136 137  K 4.6 4.1 4.2 3.8 3.8  CL 105 104 103 98* 100*  CO2 21* 22 23 26 27   GLUCOSE 167* 126* 121* 124* 134*  BUN 87* 90* 87* 61* 32*  CREATININE 5.68* 5.80* 5.71* 5.12* 4.04*  CALCIUM 8.0* 8.1* 8.2* 8.3* 8.5*  MG  --  2.9*  --   --   --  PHOS  --   --   --  6.1* 4.3   Liver Function Tests: Recent Labs  Lab 05/24/17 1605 05/29/17 0801 05/30/17 0920  AST 18  --   --   ALT 16*  --   --   ALKPHOS 105  --   --   BILITOT 0.7  --   --   PROT 6.1*  --   --   ALBUMIN 2.4* 2.4* 2.3*   Recent Labs  Lab 05/24/17 1605  LIPASE 27   No results for input(s): AMMONIA in the last 168 hours. CBC: Recent Labs  Lab 05/24/17 1154 05/25/17 0839 05/26/17 0658 05/29/17 0801 05/30/17 0920  WBC 6.9 6.0 6.4 5.5 6.2  HGB 11.7* 12.3* 11.5* 11.5* 11.7*  HCT 35.7* 39.3 36.3* 34.9* 37.0*  MCV 96.5 98.0 97.3 96.1 97.1  PLT 204 240 216 188 187   Cardiac Enzymes: Recent Labs  Lab 05/26/17 1427 05/27/17 0409 05/27/17 1345  TROPONINI <0.03 <0.03 <0.03   BNP: BNP (last 3 results) Recent Labs    05/24/17 1324  BNP 1,253.8*    ProBNP (last 3 results) No results for input(s): PROBNP in the last 8760 hours.  CBG: Recent Labs  Lab 05/29/17 1615  05/29/17 2037 05/30/17 0051 05/30/17 0418 05/30/17 0749  GLUCAP 131* 110* 109* 101* 117*       Signed:  Darlin Drop, MD [facility] 05/30/2017, 3:09 PM

## 2017-07-10 ENCOUNTER — Other Ambulatory Visit: Payer: Self-pay

## 2017-07-10 ENCOUNTER — Ambulatory Visit (INDEPENDENT_AMBULATORY_CARE_PROVIDER_SITE_OTHER): Payer: Commercial Managed Care - PPO | Admitting: Vascular Surgery

## 2017-07-10 ENCOUNTER — Encounter: Payer: Self-pay | Admitting: Vascular Surgery

## 2017-07-10 ENCOUNTER — Ambulatory Visit (HOSPITAL_COMMUNITY)
Admit: 2017-07-10 | Discharge: 2017-07-10 | Disposition: A | Discharge status: Home / Self Care | Payer: Commercial Managed Care - PPO | Attending: Vascular Surgery | Admitting: Vascular Surgery

## 2017-07-10 VITALS — BP 138/72 | HR 67 | Temp 97.8°F | Resp 18 | Ht 62.0 in | Wt 201.0 lb

## 2017-07-10 DIAGNOSIS — N186 End stage renal disease: Secondary | ICD-10-CM

## 2017-07-10 DIAGNOSIS — Z992 Dependence on renal dialysis: Secondary | ICD-10-CM | POA: Diagnosis not present

## 2017-07-10 DIAGNOSIS — N185 Chronic kidney disease, stage 5: Secondary | ICD-10-CM | POA: Insufficient documentation

## 2017-07-10 DIAGNOSIS — Z48812 Encounter for surgical aftercare following surgery on the circulatory system: Secondary | ICD-10-CM | POA: Diagnosis not present

## 2017-07-10 NOTE — Progress Notes (Signed)
Patient is a 67 year old male who returns for follow-up today after placement of a left brachiocephalic AV fistula May 27, 2017.  He has chronic baseline numbness in both hands.  This is not worse.  He is currently on dialysis via right-sided catheter.  He reports no incisional drainage.  Physical exam:  Vitals:   07/10/17 1129  BP: 138/72  Pulse: 67  Resp: 18  Temp: 97.8 F (36.6 C)  TempSrc: Oral  SpO2: 97%  Weight: 201 lb (91.2 kg)  Height: 5\' 2"  (1.575 m)    Extremities: Palpable thrill in fistula.  The fistula is visible on the skin surface over two thirds of the upper arm.  Data: Patient had a duplex ultrasound of his AV fistula today.  This showed a depth of 2 mm and a diameter of 8 mm.  Assessment: Maturing fistula left arm.  This should be ready for cannulation August 25, 2017.  The patient will follow-up on an as-needed basis.  Fabienne Brunsharles Milta Croson, MD [facility] Office: [phone] Pager: [phone]

## 2018-05-30 IMAGING — US US RENAL
1 series · 14 of 25 positions shown · non-contrast
Comparison: None.

CLINICAL DATA: Renal biopsy May 14, 2017. Chronic renal disease.

EXAM:
RENAL / URINARY TRACT ULTRASOUND COMPLETE

[Series 1: us renal · 0.26mm/px · 14 of 43 slices shown]
[im 1/43]
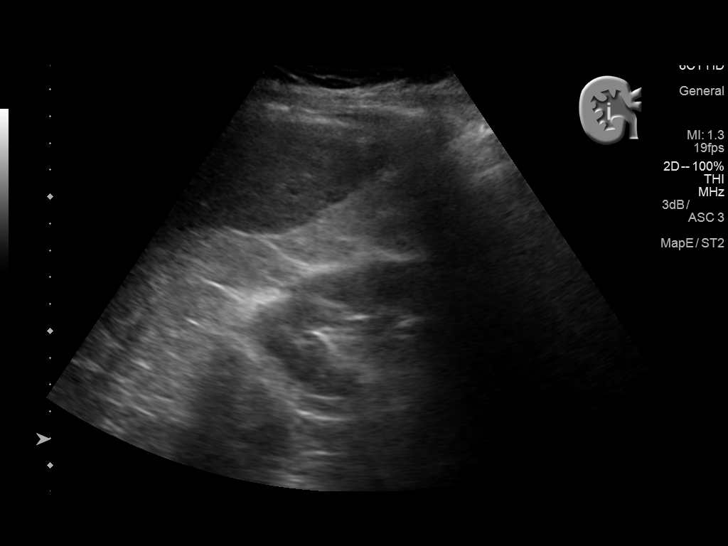
[im 4/43]
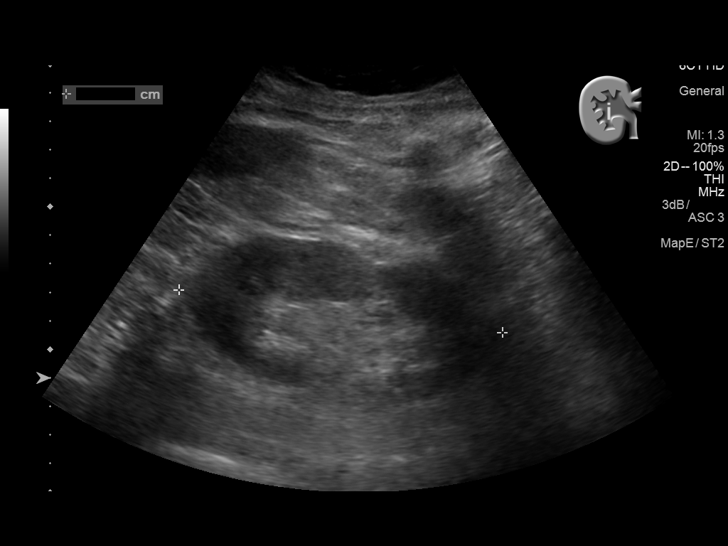
[im 8/43]
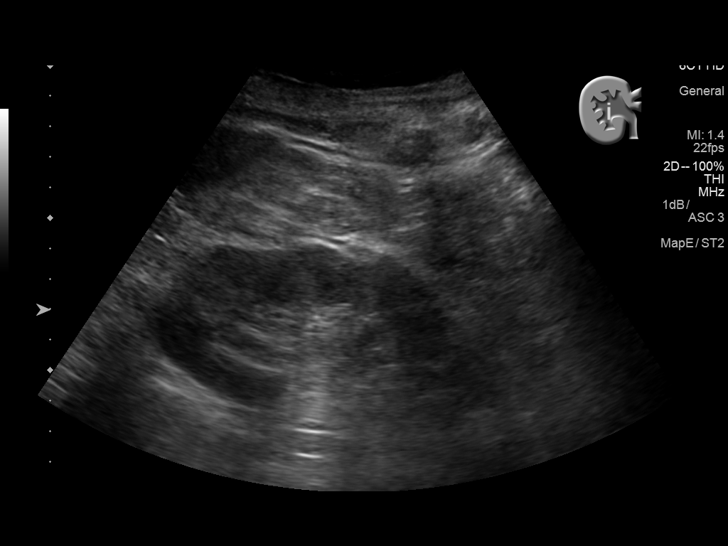
[im 11/43]
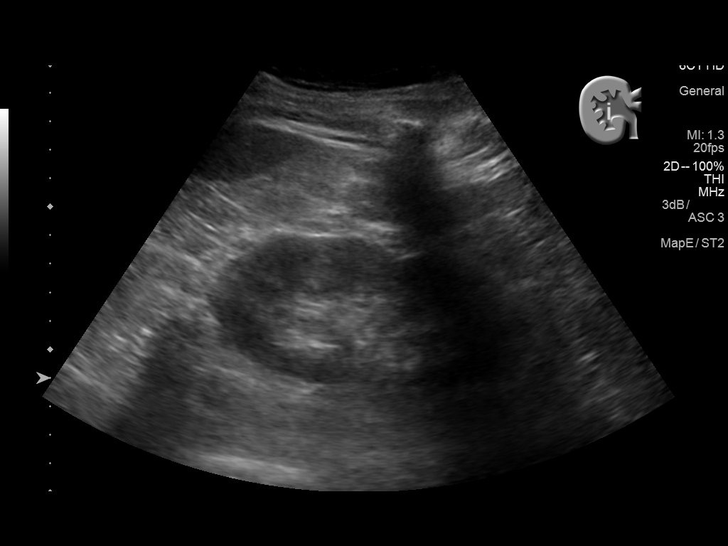
[im 15/43]
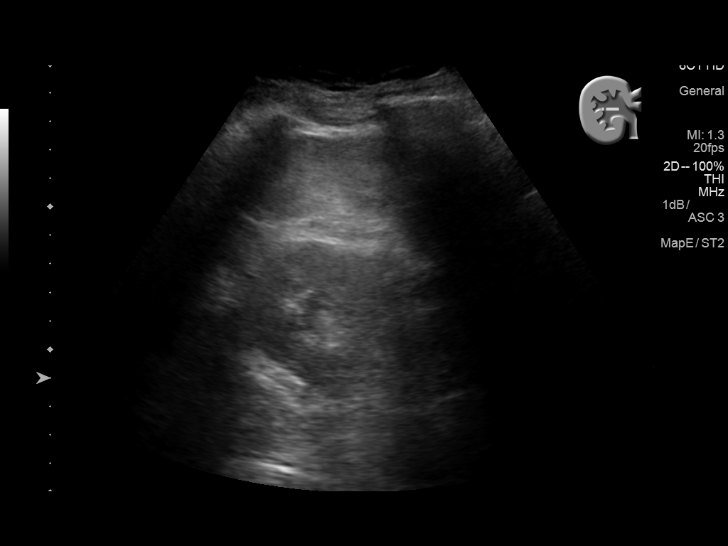
[im 16/43]
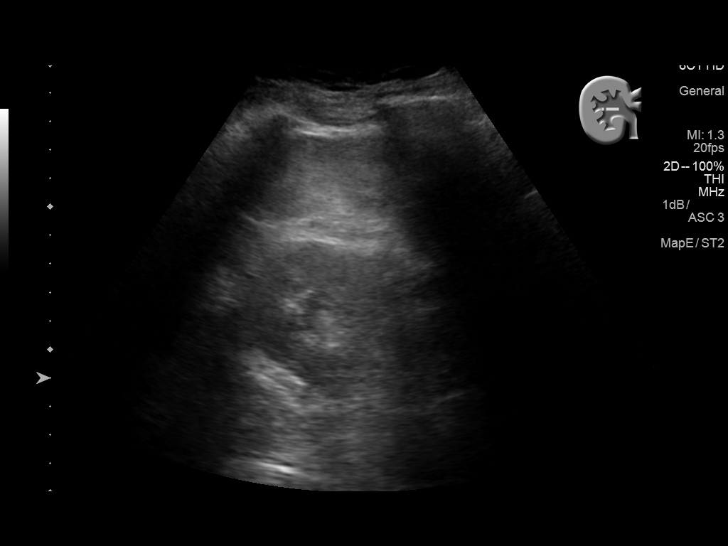
[im 20/43]
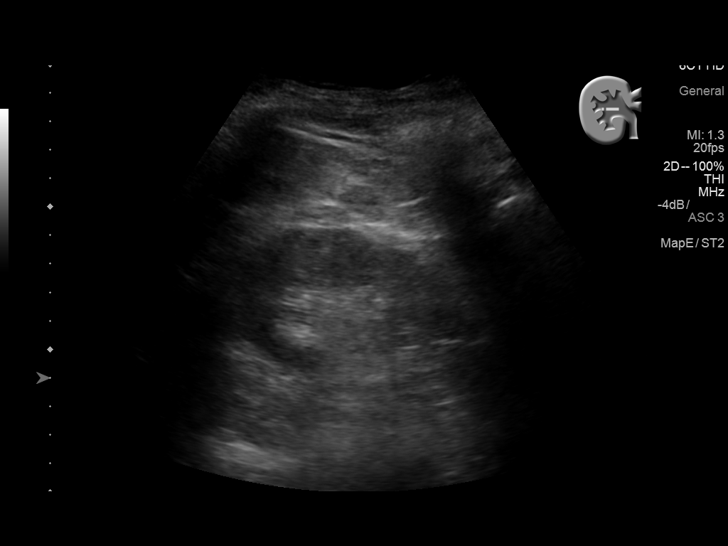
[im 23/43]
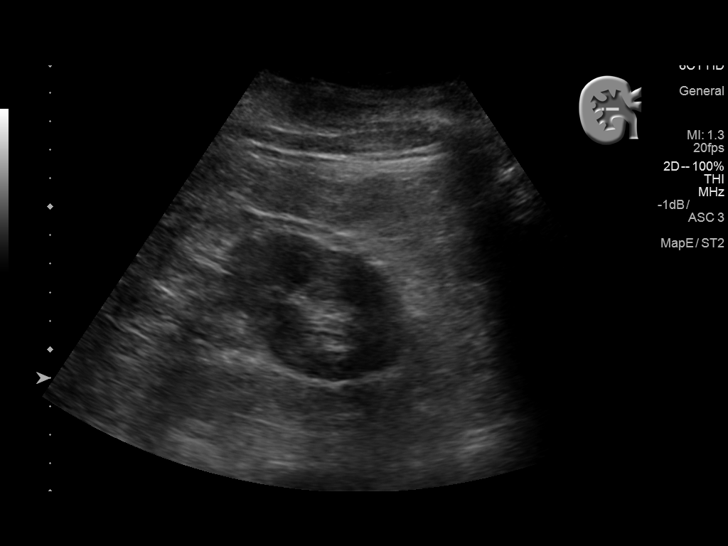
[im 27/43]
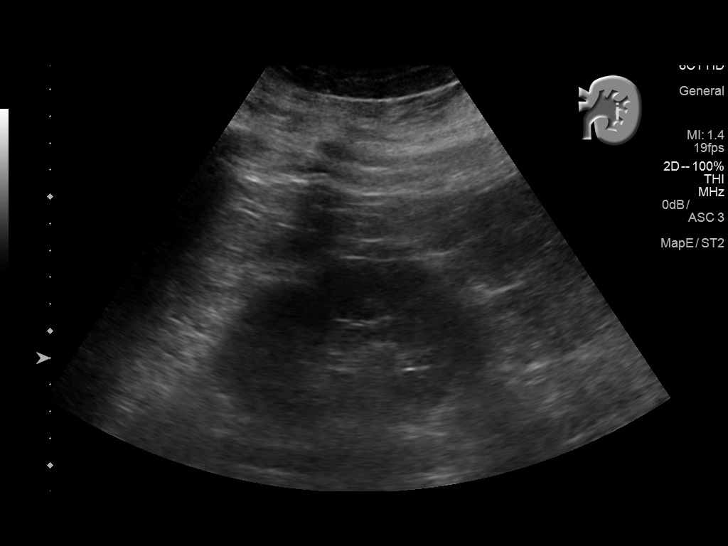
[im 29/43]
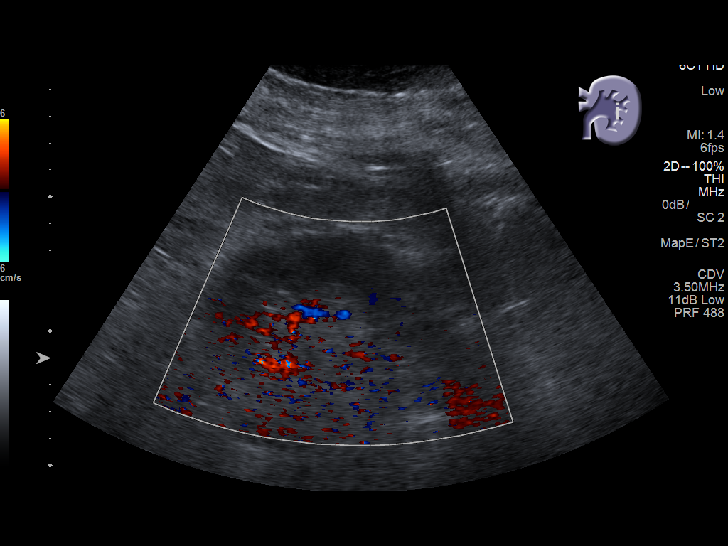
[im 32/43]
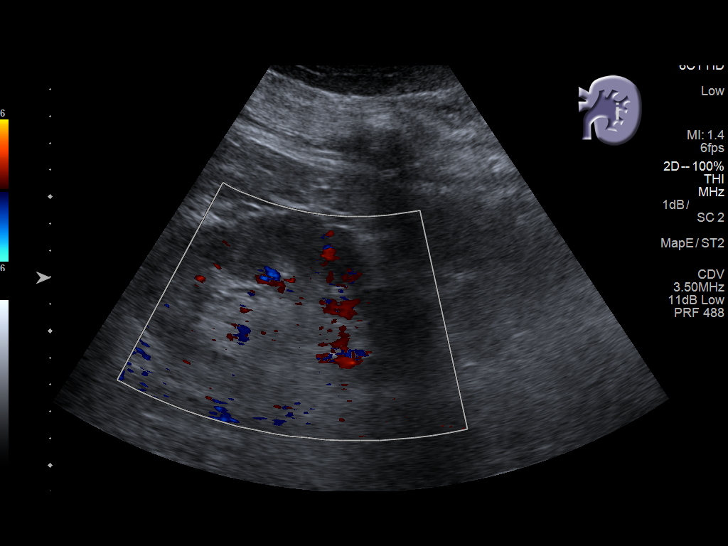
[im 36/43]
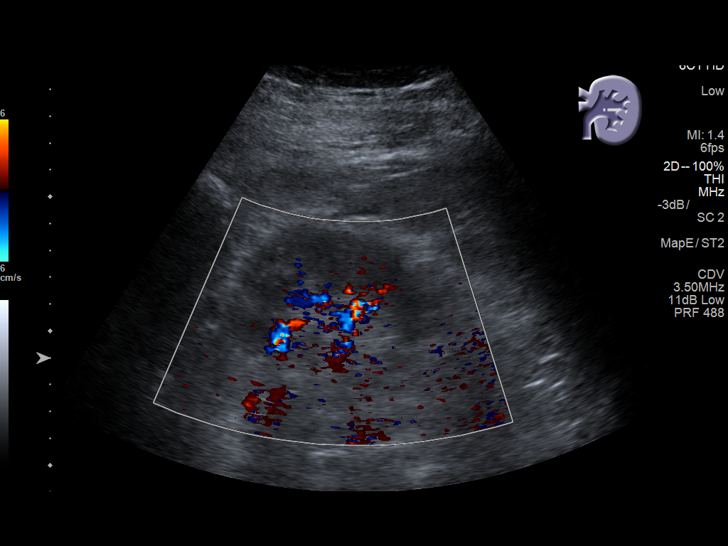
[im 39/43]
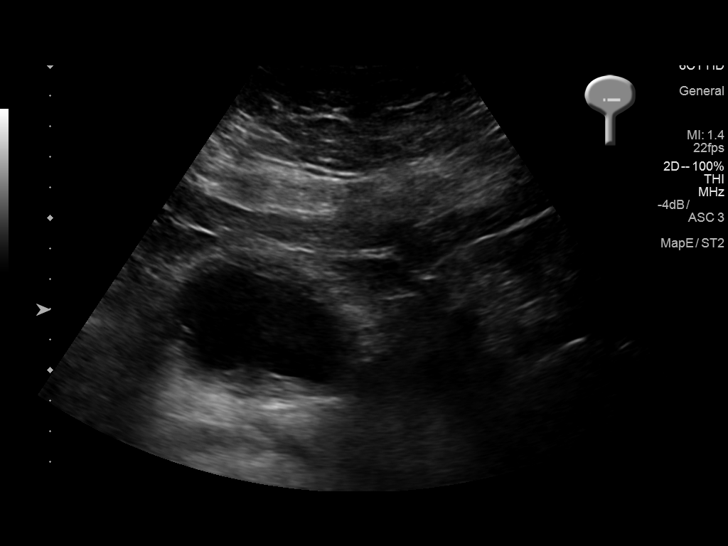
[im 43/43]
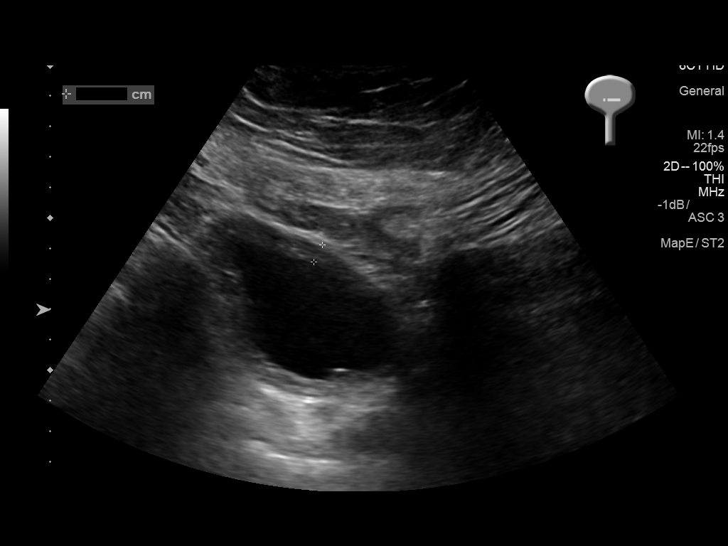

[14 of 25 positions shown; findings below may reference images not displayed]

FINDINGS: Right Kidney:

Length: 11.4 cm. Echogenicity within normal limits. No mass or
hydronephrosis visualized.

Left Kidney:

Length: 11.9 cm. Echogenicity within normal limits. No mass or
hydronephrosis visualized.

Bladder:

Bladder wall thickening measuring 6 mm.  Poor distention.
IMPRESSION: 1. The kidneys are normal in appearance.
2. The bladder wall appears somewhat thickened measuring 6 mm.
Recommend clinical correlation.

## 2019-09-13 IMAGING — DX DG CHEST 1V PORT
1 series · 1 of 1 positions shown · non-contrast
Comparison: Radiograph May 25, 2017.

CLINICAL DATA: Status post dialysis catheter placement.

EXAM:
PORTABLE CHEST 1 VIEW

[chest ap]
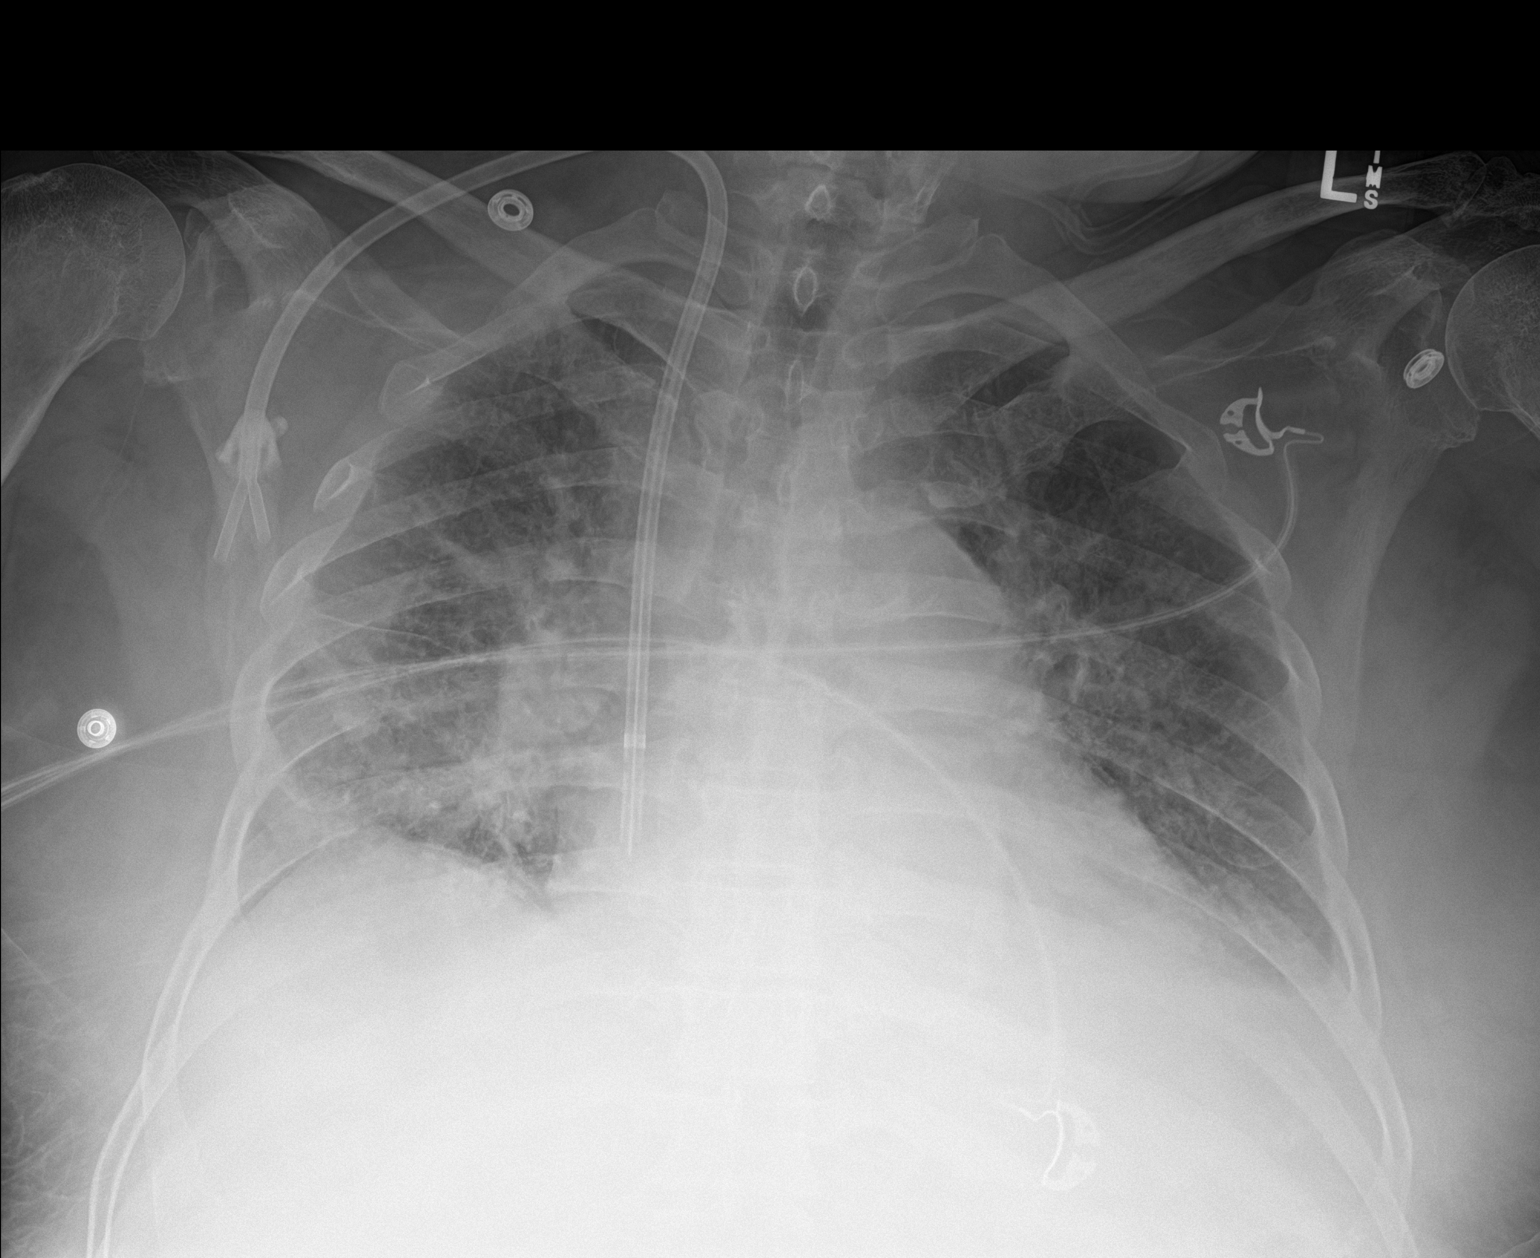

[1 of 1 positions shown; findings below may reference images not displayed]

FINDINGS: Stable cardiomediastinal silhouette. Stable mild central pulmonary
vascular congestion is noted with possible bilateral pulmonary
edema. Mild left pleural effusion is again noted and stable.
Interval placement of right internal jugular catheter with distal
tip in expected position of cavoatrial junction. No pneumothorax is
noted. Bony thorax is unremarkable.
IMPRESSION: Stable central pulmonary vascular congestion is noted with possible
bilateral pulmonary edema. Stable mild left pleural effusion.
Interval placement of right internal jugular dialysis catheter with
distal tip in expected position of cavoatrial junction.
